# Patient Record
Sex: Male | Born: 1940 | Race: Black or African American | Hispanic: No | Marital: Married | Smoking: Former smoker
Health system: Southern US, Community
[De-identification: ages and names within clinical notes are randomized; demographics above are authoritative.]

## PROBLEM LIST (undated history)

## (undated) DIAGNOSIS — G4730 Sleep apnea, unspecified: Secondary | ICD-10-CM

## (undated) DIAGNOSIS — J45909 Unspecified asthma, uncomplicated: Secondary | ICD-10-CM

## (undated) DIAGNOSIS — R519 Headache, unspecified: Secondary | ICD-10-CM

## (undated) DIAGNOSIS — G8929 Other chronic pain: Secondary | ICD-10-CM

## (undated) DIAGNOSIS — I10 Essential (primary) hypertension: Secondary | ICD-10-CM

## (undated) DIAGNOSIS — E785 Hyperlipidemia, unspecified: Secondary | ICD-10-CM

## (undated) HISTORY — DX: Sleep apnea, unspecified: G47.30

## (undated) HISTORY — DX: Hyperlipidemia, unspecified: E78.5

## (undated) HISTORY — DX: Other chronic pain: G89.29

## (undated) HISTORY — DX: Headache, unspecified: R51.9

---

## 2019-12-29 DIAGNOSIS — Z809 Family history of malignant neoplasm, unspecified: Secondary | ICD-10-CM | POA: Diagnosis not present

## 2019-12-29 DIAGNOSIS — N400 Enlarged prostate without lower urinary tract symptoms: Secondary | ICD-10-CM | POA: Diagnosis not present

## 2019-12-29 DIAGNOSIS — I10 Essential (primary) hypertension: Secondary | ICD-10-CM | POA: Diagnosis not present

## 2019-12-29 DIAGNOSIS — Z9181 History of falling: Secondary | ICD-10-CM | POA: Diagnosis not present

## 2019-12-29 DIAGNOSIS — R269 Unspecified abnormalities of gait and mobility: Secondary | ICD-10-CM | POA: Diagnosis not present

## 2019-12-29 DIAGNOSIS — Z87891 Personal history of nicotine dependence: Secondary | ICD-10-CM | POA: Diagnosis not present

## 2019-12-29 DIAGNOSIS — R69 Illness, unspecified: Secondary | ICD-10-CM | POA: Diagnosis not present

## 2019-12-29 DIAGNOSIS — Z96649 Presence of unspecified artificial hip joint: Secondary | ICD-10-CM | POA: Diagnosis not present

## 2019-12-29 DIAGNOSIS — Z8249 Family history of ischemic heart disease and other diseases of the circulatory system: Secondary | ICD-10-CM | POA: Diagnosis not present

## 2020-01-12 DIAGNOSIS — M25562 Pain in left knee: Secondary | ICD-10-CM | POA: Diagnosis not present

## 2020-01-12 DIAGNOSIS — M1711 Unilateral primary osteoarthritis, right knee: Secondary | ICD-10-CM | POA: Diagnosis not present

## 2020-01-12 DIAGNOSIS — M25552 Pain in left hip: Secondary | ICD-10-CM | POA: Diagnosis not present

## 2020-01-12 DIAGNOSIS — M25561 Pain in right knee: Secondary | ICD-10-CM | POA: Diagnosis not present

## 2020-01-12 DIAGNOSIS — M25559 Pain in unspecified hip: Secondary | ICD-10-CM | POA: Diagnosis not present

## 2020-01-12 DIAGNOSIS — M25551 Pain in right hip: Secondary | ICD-10-CM | POA: Diagnosis not present

## 2020-01-12 DIAGNOSIS — M1712 Unilateral primary osteoarthritis, left knee: Secondary | ICD-10-CM | POA: Diagnosis not present

## 2020-02-05 DIAGNOSIS — M8580 Other specified disorders of bone density and structure, unspecified site: Secondary | ICD-10-CM | POA: Diagnosis not present

## 2020-02-05 DIAGNOSIS — R972 Elevated prostate specific antigen [PSA]: Secondary | ICD-10-CM | POA: Diagnosis not present

## 2020-02-05 DIAGNOSIS — M170 Bilateral primary osteoarthritis of knee: Secondary | ICD-10-CM | POA: Diagnosis not present

## 2020-02-05 DIAGNOSIS — K5904 Chronic idiopathic constipation: Secondary | ICD-10-CM | POA: Diagnosis not present

## 2020-02-05 DIAGNOSIS — R739 Hyperglycemia, unspecified: Secondary | ICD-10-CM | POA: Diagnosis not present

## 2020-02-05 DIAGNOSIS — N529 Male erectile dysfunction, unspecified: Secondary | ICD-10-CM | POA: Diagnosis not present

## 2020-02-05 DIAGNOSIS — R350 Frequency of micturition: Secondary | ICD-10-CM | POA: Diagnosis not present

## 2020-02-05 DIAGNOSIS — E559 Vitamin D deficiency, unspecified: Secondary | ICD-10-CM | POA: Diagnosis not present

## 2020-02-05 DIAGNOSIS — H9193 Unspecified hearing loss, bilateral: Secondary | ICD-10-CM | POA: Diagnosis not present

## 2020-02-05 DIAGNOSIS — N401 Enlarged prostate with lower urinary tract symptoms: Secondary | ICD-10-CM | POA: Diagnosis not present

## 2020-02-05 DIAGNOSIS — I10 Essential (primary) hypertension: Secondary | ICD-10-CM | POA: Diagnosis not present

## 2020-05-02 ENCOUNTER — Emergency Department (HOSPITAL_COMMUNITY): Payer: Medicare (Managed Care)

## 2020-05-02 ENCOUNTER — Other Ambulatory Visit: Payer: Self-pay

## 2020-05-02 ENCOUNTER — Encounter (HOSPITAL_COMMUNITY): Payer: Self-pay

## 2020-05-02 ENCOUNTER — Emergency Department (HOSPITAL_COMMUNITY)
Admission: EM | Admit: 2020-05-02 | Discharge: 2020-05-02 | Disposition: A | Discharge status: Home / Self Care | Payer: Medicare (Managed Care) | Attending: Emergency Medicine | Admitting: Emergency Medicine

## 2020-05-02 DIAGNOSIS — I10 Essential (primary) hypertension: Secondary | ICD-10-CM | POA: Insufficient documentation

## 2020-05-02 DIAGNOSIS — J4521 Mild intermittent asthma with (acute) exacerbation: Secondary | ICD-10-CM | POA: Insufficient documentation

## 2020-05-02 DIAGNOSIS — R0602 Shortness of breath: Secondary | ICD-10-CM | POA: Diagnosis present

## 2020-05-02 HISTORY — DX: Unspecified asthma, uncomplicated: J45.909

## 2020-05-02 HISTORY — DX: Essential (primary) hypertension: I10

## 2020-05-02 LAB — BASIC METABOLIC PANEL
Anion gap: 8 (ref 5–15)
BUN: 14 mg/dL (ref 8–23)
CO2: 29 mmol/L (ref 22–32)
Calcium: 9.4 mg/dL (ref 8.9–10.3)
Chloride: 104 mmol/L (ref 98–111)
Creatinine, Ser: 0.89 mg/dL (ref 0.61–1.24)
GFR calc Af Amer: >60 mL/min (ref >60)
GFR calc non Af Amer: >60 mL/min (ref >60)
Glucose, Bld: 112 mg/dL — ABNORMAL HIGH (ref 70–99)
Potassium: 4.5 mmol/L (ref 3.5–5.1)
Sodium: 141 mmol/L (ref 135–145)

## 2020-05-02 LAB — CBC
HCT: 41.3 % (ref 39.0–52.0)
Hemoglobin: 12.8 g/dL — ABNORMAL LOW (ref 13.0–17.0)
MCH: 30.8 pg (ref 26.0–34.0)
MCHC: 31 g/dL (ref 30.0–36.0)
MCV: 99.5 fL (ref 80.0–100.0)
Platelets: 262 10*3/uL (ref 150–400)
RBC: 4.15 MIL/uL — ABNORMAL LOW (ref 4.22–5.81)
RDW: 14.3 % (ref 11.5–15.5)
WBC: 8.2 10*3/uL (ref 4.0–10.5)
nRBC: 0 % (ref 0.0–0.2)

## 2020-05-02 LAB — TROPONIN I (HIGH SENSITIVITY): Troponin I (High Sensitivity): 11 ng/L (ref <18)

## 2020-05-02 MED ORDER — IPRATROPIUM BROMIDE HFA 17 MCG/ACT IN AERS
2.0000 | INHALATION_SPRAY | Freq: Once | RESPIRATORY_TRACT | Status: AC
  Administered 2020-05-02: 2 via RESPIRATORY_TRACT
  Filled 2020-05-02: qty 12.9

## 2020-05-02 MED ORDER — ALBUTEROL SULFATE HFA 108 (90 BASE) MCG/ACT IN AERS: 2.0000 | INHALATION_SPRAY | RESPIRATORY_TRACT | 0 refills | Status: DC | PRN

## 2020-05-02 MED ORDER — AEROCHAMBER PLUS FLO-VU LARGE MISC
1.0000 | Freq: Once | Status: AC
  Administered 2020-05-02: 1

## 2020-05-02 MED ORDER — IPRATROPIUM BROMIDE HFA 17 MCG/ACT IN AERS
2.0000 | INHALATION_SPRAY | Freq: Once | RESPIRATORY_TRACT | Status: AC
  Administered 2020-05-02: 2 via RESPIRATORY_TRACT

## 2020-05-02 MED ORDER — ALBUTEROL SULFATE HFA 108 (90 BASE) MCG/ACT IN AERS
8.0000 | INHALATION_SPRAY | Freq: Once | RESPIRATORY_TRACT | Status: AC
  Administered 2020-05-02: 8 via RESPIRATORY_TRACT

## 2020-05-02 MED ORDER — DEXAMETHASONE 4 MG PO TABS
10.0000 mg | ORAL_TABLET | Freq: Once | ORAL | Status: AC
  Administered 2020-05-02: 10 mg via ORAL
  Filled 2020-05-02: qty 3

## 2020-05-02 MED ORDER — SODIUM CHLORIDE 0.9% FLUSH: 3.0000 mL | Freq: Once | INTRAVENOUS | Status: DC

## 2020-05-02 MED ORDER — ALBUTEROL SULFATE HFA 108 (90 BASE) MCG/ACT IN AERS
8.0000 | INHALATION_SPRAY | Freq: Once | RESPIRATORY_TRACT | Status: AC
  Administered 2020-05-02: 8 via RESPIRATORY_TRACT
  Filled 2020-05-02: qty 6.7

## 2020-05-02 NOTE — ED Notes (Signed)
The pt still has audible wheezes but less and the pt reports that he feels and breathes much better  He reports that he has been breathing like this for 2-3 weeks  They just moved and the male in the room reports that she has not been able to find his meds

## 2020-05-02 NOTE — ED Triage Notes (Signed)
Patient complains of 2 days of cough, congestion and pain with inspiration. States that this started after getting caught in the rain on Thursday

## 2020-05-02 NOTE — ED Provider Notes (Signed)
[facility] EMERGENCY DEPARTMENT Provider Note   CSN: [id number] Arrival date & time: 05/02/20  1407     History Chief Complaint  Patient presents with  . SOB/ cough    Edward Hurst is a 79 y.o. male.  Patient is an 79 year old male presenting with wheezing and shortness of breath after cold and rain exposure on Thursday.  Patient's ABCs intact on arrival.  Patient has audible wheezing.  The history is provided by the patient and the spouse.  Illness Location:  Respiratory Quality:  Wheezing Severity:  Moderate Onset quality:  Gradual Timing:  Constant Progression:  Unchanged Chronicity:  Recurrent Context:  Patient has a history of asthma, ran out of inhalers Relieved by:  Nothing tried Worsened by:  Nothing Ineffective treatments:  None tried Associated symptoms: shortness of breath and wheezing   Associated symptoms: no abdominal pain, no chest pain, no congestion, no cough, no diarrhea, no fever, no headaches, no loss of consciousness, no nausea and no vomiting        Past Medical History:  Diagnosis Date  . Asthma   . Hypertension     There are no problems to display for this patient.   History reviewed. No pertinent surgical history.     No family history on file.  Social History   Tobacco Use  . Smoking status: Not on file  Substance Use Topics  . Alcohol use: Not on file  . Drug use: Not on file    Home Medications Prior to Admission medications   Medication Sig Start Date End Date Taking? Authorizing Provider  albuterol (VENTOLIN HFA) 108 (90 Base) MCG/ACT inhaler Inhale 2 puffs into the lungs every 4 (four) hours as needed for wheezing or shortness of breath. 05/02/20   Janeece Fitting, MD    Allergies    Patient has no known allergies.  Review of Systems   Review of Systems  Constitutional: Negative for fever.  HENT: Negative for congestion.   Respiratory: Positive for shortness of breath and wheezing. Negative for  cough.   Cardiovascular: Negative for chest pain.  Gastrointestinal: Negative for abdominal pain, diarrhea, nausea and vomiting.  Neurological: Negative for loss of consciousness and headaches.  All other systems reviewed and are negative.   Physical Exam Updated Vital Signs BP (!) 164/92 (BP Location: Right Arm)   Pulse (!) 102   Temp 98.2 F (36.8 C) (Oral)   Resp (!) 22   Ht 5\' 9"  (1.753 m)   Wt 63.5 kg   SpO2 95%   BMI 20.67 kg/m   Physical Exam Vitals and nursing note reviewed.  Constitutional:      Appearance: He is well-developed.  HENT:     Head: Normocephalic and atraumatic.     Right Ear: External ear normal.     Left Ear: External ear normal.     Nose: Nose normal.     Mouth/Throat:     Mouth: Mucous membranes are moist.     Comments: Poor dentition Eyes:     Extraocular Movements: Extraocular movements intact.     Conjunctiva/sclera: Conjunctivae normal.  Cardiovascular:     Rate and Rhythm: Normal rate and regular rhythm.     Heart sounds: No murmur heard.   Pulmonary:     Effort: Pulmonary effort is normal. No respiratory distress.     Breath sounds: Wheezing present.     Comments: Patient had expiratory wheezing in all fields prior to inhaler treatment.  Wheezing significant improved after initial  treatment but still present.  Additional improvement after second treatment. Abdominal:     Palpations: Abdomen is soft.     Tenderness: There is no abdominal tenderness.  Musculoskeletal:        General: No swelling, tenderness, deformity or signs of injury. Normal range of motion.     Cervical back: Neck supple.  Skin:    General: Skin is warm and dry.  Neurological:     General: No focal deficit present.     Mental Status: He is alert and oriented to person, place, and time.  Psychiatric:        Mood and Affect: Mood normal.        Behavior: Behavior normal.     ED Results / Procedures / Treatments   Labs (all labs ordered are listed, but only  abnormal results are displayed) Labs Reviewed  BASIC METABOLIC PANEL - Abnormal; Notable for the following components:      Result Value   Glucose, Bld 112 (*)    All other components within normal limits  CBC - Abnormal; Notable for the following components:   RBC 4.15 (*)    Hemoglobin 12.8 (*)    All other components within normal limits  POC SARS CORONAVIRUS 2 AG -  ED  TROPONIN I (HIGH SENSITIVITY)    EKG EKG Interpretation  Date/Time:  Sunday May 02 2020 14:13:27 EDT Ventricular Rate:  88 PR Interval:  184 QRS Duration: 76 QT Interval:  340 QTC Calculation: 411 R Axis:   80 Text Interpretation: Normal sinus rhythm Possible Anterior infarct , age undetermined No previous tracing Confirmed by Gwyneth Sprout ([id number]) on 05/02/2020 4:01:21 PM   Radiology DG Chest 2 View  Result Date: 05/02/2020 CLINICAL DATA:  Shortness of breath.  Pain on inspiration. EXAM: CHEST - 2 VIEW COMPARISON:  No prior exams available. FINDINGS: Upper normal heart size. Aortic tortuosity. Mild peribronchial thickening of unknown acuity. Questionable not definite 2.1 cm nodule in the right mid lung zone versus vascular overlap. No focal airspace disease, pneumothorax, or pleural effusion. No pulmonary edema. Age related degenerative change in the spine and shoulders. No acute osseous abnormalities are seen. IMPRESSION: 1. Mild peribronchial thickening of unknown acuity. 2. Questionable 2.1 cm nodule in the right mid lung zone versus vascular overlap. There are no prior exams available for comparison. Recommend further evaluation with chest CT. This could be performed on an elective basis. 3. Upper normal heart size with aortic tortuosity. Electronically Signed   By: Narda Rutherford M.D.   On: 05/02/2020 15:23    Procedures Procedures (including critical care time)  Medications Ordered in ED Medications  albuterol (VENTOLIN HFA) 108 (90 Base) MCG/ACT inhaler 8 puff (8 puffs Inhalation Given 05/02/20  1615)  dexamethasone (DECADRON) tablet 10 mg (10 mg Oral Given 05/02/20 1621)  ipratropium (ATROVENT HFA) inhaler 2 puff (2 puffs Inhalation Given 05/02/20 1615)  AeroChamber Plus Flo-Vu Large MISC 1 each (1 each Other Given 05/02/20 1618)  ipratropium (ATROVENT HFA) inhaler 2 puff (2 puffs Inhalation Given 05/02/20 1749)  albuterol (VENTOLIN HFA) 108 (90 Base) MCG/ACT inhaler 8 puff (8 puffs Inhalation Given 05/02/20 1749)    ED Course  I have reviewed the triage vital signs and the nursing notes.  Pertinent labs & imaging results that were available during my care of the patient were reviewed by me and considered in my medical decision making (see chart for details).    MDM Rules/Calculators/A&P  Differential diagnosis: Asthma exacerbation, COVID-19, pneumonia ED physician interpretation of imaging: Chest x-ray without hemopneumothorax, wide mediastinum, focal pneumonia. ED physician interpretation of EKG: No STEMI. NSR ED physician interpretation of labs: Patient's lab work including CBC, BMP, troponin and Covid swab without critical values.  MDM: Patient is a 79 year old male with a history of asthma who has lost his inhalers during a move presents to the ED with several days of wheezing, cough that improved with two times MDI treatment with unremarkable chest x-ray troponin and lab work found to have asthma exacerbation that resolved with ED treatment appropriate for discharge home outpatient follow-up.  Patient's vital signs are stable, patient afebrile.  His physical exam is remarkable for wheezing that improved with Atrovent albuterol treatments.  Patient was educated on use of AeroChamber and inhalers and did well.  Steroids given.  Social work contacted and talked the patient about places to follow-up outpatient as the family recently moved to [place].  Patient in bed without difficulty.  Patient has no chest pain and chest x-ray, EKG and one troponin  unremarkable.,  Doubt ACS.  Most likely patient had acute asthma exacerbation due to loss of rescue medications.  Diagnosis, treatment and plan of care was discussed and agreed upon with patient.  Patient comfortable with discharge at this time.   Key discharge instructions: You presented to the ED with wheezing and shortness of breath consistent with an asthma exacerbation.  You are given 2 rounds of metered-dose inhaler with spacer with improvement of symptoms.  Additionally were given oral steroids that should help prevent recurrence over the next several days.  Recommend taking scheduled 4 puffs albuterol every 6 hours for the next 2 days.  Then take albuterol every 4 hours as needed.  If you feel like you are having an asthma exacerbation in the future you may take the Atrovent inhaler 2 puffs at that time.  Please call the number listed in your discharge paperwork to establish primary care and follow-up for your asthma and hypertension.   Final Clinical Impression(s) / ED Diagnoses Final diagnoses:  Mild intermittent asthma with exacerbation    Rx / DC Orders ED Discharge Orders         Ordered    albuterol (VENTOLIN HFA) 108 (90 Base) MCG/ACT inhaler  Every 4 hours PRN     Discontinue  Reprint     05/02/20 1904           Delma Post, MD 05/03/20 0932    Blanchie Dessert, MD 05/03/20 2151

## 2020-05-02 NOTE — Discharge Instructions (Signed)
You presented to the ED with wheezing and shortness of breath consistent with an asthma exacerbation.  You are given 2 rounds of metered-dose inhaler with spacer with improvement of symptoms.  Additionally were given oral steroids that should help prevent recurrence over the next several days.  Recommend taking scheduled 4 puffs albuterol every 6 hours for the next 2 days.  Then take albuterol every 4 hours as needed.  If you feel like you are having an asthma exacerbation in the future you may take the Atrovent inhaler 2 puffs at that time.  Please call the number listed in your discharge paperwork to establish primary care and follow-up for your asthma and hypertension.

## 2020-05-02 NOTE — ED Notes (Signed)
Pt was ambulated between hallways with no falls or injuries. Pt O2 remained between 95-100%. Pt had some wheezing but pt denies difficulty breathing at this time. Pt was placed back into bed with O2 and cardiac monitor hookup.

## 2020-05-07 ENCOUNTER — Emergency Department (HOSPITAL_COMMUNITY)
Admission: EM | Admit: 2020-05-07 | Discharge: 2020-05-08 | Disposition: A | Discharge status: Home / Self Care | Payer: Medicare (Managed Care) | Attending: Emergency Medicine | Admitting: Emergency Medicine

## 2020-05-07 ENCOUNTER — Other Ambulatory Visit: Payer: Self-pay

## 2020-05-07 ENCOUNTER — Encounter (HOSPITAL_COMMUNITY): Payer: Self-pay | Admitting: Emergency Medicine

## 2020-05-07 DIAGNOSIS — U071 COVID-19: Secondary | ICD-10-CM | POA: Diagnosis not present

## 2020-05-07 DIAGNOSIS — R0602 Shortness of breath: Secondary | ICD-10-CM | POA: Diagnosis present

## 2020-05-07 DIAGNOSIS — J45909 Unspecified asthma, uncomplicated: Secondary | ICD-10-CM | POA: Insufficient documentation

## 2020-05-07 DIAGNOSIS — I10 Essential (primary) hypertension: Secondary | ICD-10-CM | POA: Diagnosis not present

## 2020-05-07 DIAGNOSIS — Z87891 Personal history of nicotine dependence: Secondary | ICD-10-CM | POA: Diagnosis not present

## 2020-05-07 DIAGNOSIS — R062 Wheezing: Secondary | ICD-10-CM

## 2020-05-07 LAB — CBC
HCT: 38.4 % — ABNORMAL LOW (ref 39.0–52.0)
Hemoglobin: 12.2 g/dL — ABNORMAL LOW (ref 13.0–17.0)
MCH: 31.1 pg (ref 26.0–34.0)
MCHC: 31.8 g/dL (ref 30.0–36.0)
MCV: 98 fL (ref 80.0–100.0)
Platelets: 213 10*3/uL (ref 150–400)
RBC: 3.92 MIL/uL — ABNORMAL LOW (ref 4.22–5.81)
RDW: 14.6 % (ref 11.5–15.5)
WBC: 7.9 10*3/uL (ref 4.0–10.5)
nRBC: 0 % (ref 0.0–0.2)

## 2020-05-07 LAB — D-DIMER, QUANTITATIVE: D-Dimer, Quant: 0.51 ug/mL-FEU — ABNORMAL HIGH (ref 0.00–0.50)

## 2020-05-07 LAB — COMPREHENSIVE METABOLIC PANEL
ALT: 25 U/L (ref 0–44)
AST: 23 U/L (ref 15–41)
Albumin: 3.7 g/dL (ref 3.5–5.0)
Alkaline Phosphatase: 75 U/L (ref 38–126)
Anion gap: 6 (ref 5–15)
BUN: 12 mg/dL (ref 8–23)
CO2: 27 mmol/L (ref 22–32)
Calcium: 8.8 mg/dL — ABNORMAL LOW (ref 8.9–10.3)
Chloride: 100 mmol/L (ref 98–111)
Creatinine, Ser: 0.76 mg/dL (ref 0.61–1.24)
GFR calc Af Amer: >60 mL/min (ref >60)
GFR calc non Af Amer: >60 mL/min (ref >60)
Glucose, Bld: 151 mg/dL — ABNORMAL HIGH (ref 70–99)
Potassium: 4.4 mmol/L (ref 3.5–5.1)
Sodium: 133 mmol/L — ABNORMAL LOW (ref 135–145)
Total Bilirubin: 0.9 mg/dL (ref 0.3–1.2)
Total Protein: 6.6 g/dL (ref 6.5–8.1)

## 2020-05-07 LAB — POC SARS CORONAVIRUS 2 AG -  ED: SARS Coronavirus 2 Ag: POSITIVE — AB

## 2020-05-07 MED ORDER — ALBUTEROL SULFATE (2.5 MG/3ML) 0.083% IN NEBU
5.0000 mg | INHALATION_SOLUTION | Freq: Once | RESPIRATORY_TRACT | Status: AC
  Administered 2020-05-07: 5 mg via RESPIRATORY_TRACT
  Filled 2020-05-07: qty 6

## 2020-05-07 MED ORDER — MAGNESIUM SULFATE 2 GM/50ML IV SOLN
2.0000 g | Freq: Once | INTRAVENOUS | Status: AC
  Administered 2020-05-07: 2 g via INTRAVENOUS
  Filled 2020-05-07: qty 50

## 2020-05-07 NOTE — ED Provider Notes (Signed)
[facility] EMERGENCY DEPARTMENT Provider Note   CSN: [id number] Arrival date & time: 05/07/20  2219     History Chief Complaint  Patient presents with  . Shortness of Breath    Edward Hurst is a 78 y.o. male presenting for evaluation of shortness of breath.  Patient states the past week, he has had shortness of breath.  He was seen in the ER, given breathing treatments and a nebulizer with improvement of symptoms.  However tonight, his breathing worsened again.  He was sent home with inhalers, but did not use them today.  He denies fevers, chills, chest pain, nausea, vomiting, abdominal pain.  He reports a history of lung problems, although is not sure if he has officially been diagnosed with asthma and/or COPD.  He does not wear oxygen at home.  He recently moved to [place] from [place] a month ago, has not yet established with primary care but has an appointment scheduled for later this month. He drove (5 day trip) 1 month ago from [place] to [place].  He does not smoke cigarettes, though he used to, quit 30 years ago.  He was given a breathing tx and solumedrol with EMS, reports resolution of his SOB.  He reports no additional history of prediabetes (on Metformin) and hypertension.  Additional history obtained from chart review. Reviewed previous ED visit.   HPI     Past Medical History:  Diagnosis Date  . Asthma   . Hypertension     There are no problems to display for this patient.   History reviewed. No pertinent surgical history.     No family history on file.  Social History   Tobacco Use  . Smoking status: Former Research scientist (life sciences)  . Smokeless tobacco: Never Used  Substance Use Topics  . Alcohol use: Not Currently  . Drug use: Never    Home Medications Prior to Admission medications   Medication Sig Start Date End Date Taking? Authorizing Provider  albuterol (VENTOLIN HFA) 108 (90 Base) MCG/ACT inhaler Inhale 2 puffs into the lungs every 4 (four) hours as  needed for wheezing or shortness of breath. 05/02/20   Delma Post, MD  benzonatate (TESSALON) 100 MG capsule Take 1 capsule (100 mg total) by mouth 2 (two) times daily as needed for cough. 05/08/20   Quindell Shere, PA-C  predniSONE (DELTASONE) 20 MG tablet Take 2 tablets (40 mg total) by mouth daily for 4 days. 05/08/20 05/12/20  Wylie Russon, PA-C    Allergies    Patient has no known allergies.  Review of Systems   Review of Systems  Respiratory: Positive for cough, shortness of breath and wheezing.   All other systems reviewed and are negative.   Physical Exam Updated Vital Signs BP 123/85   Pulse 81   Temp 98.5 F (36.9 C) (Oral)   Resp (!) 24   SpO2 97%   Physical Exam Vitals and nursing note reviewed.  Constitutional:      General: He is not in acute distress.    Appearance: He is well-developed.     Comments: Elderly male who appears nontoxic  HENT:     Head: Normocephalic and atraumatic.  Eyes:     Conjunctiva/sclera: Conjunctivae normal.     Pupils: Pupils are equal, round, and reactive to light.  Cardiovascular:     Rate and Rhythm: Normal rate and regular rhythm.     Pulses: Normal pulses.  Pulmonary:     Effort: Pulmonary effort is normal. No respiratory  distress.     Breath sounds: Wheezing present.     Comments: Expiratory wheezing in all fields. Speaking in full sentences. sats stable on RA Abdominal:     General: There is no distension.     Palpations: Abdomen is soft. There is no mass.     Tenderness: There is no abdominal tenderness. There is no guarding or rebound.  Musculoskeletal:        General: Normal range of motion.     Cervical back: Normal range of motion and neck supple.     Right lower leg: No edema.     Left lower leg: No edema.     Comments: No leg pain or swelling  Skin:    General: Skin is warm and dry.     Capillary Refill: Capillary refill takes less than 2 seconds.  Neurological:     Mental Status: He is alert and  oriented to person, place, and time.     ED Results / Procedures / Treatments   Labs (all labs ordered are listed, but only abnormal results are displayed) Labs Reviewed  D-DIMER, QUANTITATIVE (NOT AT [facility]) - Abnormal; Notable for the following components:      Result Value   D-Dimer, Quant 0.51 (*)    All other components within normal limits  CBC - Abnormal; Notable for the following components:   RBC 3.92 (*)    Hemoglobin 12.2 (*)    HCT 38.4 (*)    All other components within normal limits  COMPREHENSIVE METABOLIC PANEL - Abnormal; Notable for the following components:   Sodium 133 (*)    Glucose, Bld 151 (*)    Calcium 8.8 (*)    All other components within normal limits  POC SARS CORONAVIRUS 2 AG -  ED - Abnormal; Notable for the following components:   SARS Coronavirus 2 Ag POSITIVE (*)    All other components within normal limits  TROPONIN I (HIGH SENSITIVITY)    EKG EKG Interpretation  Date/Time:  Friday May 07 2020 22:24:09 EDT Ventricular Rate:  86 PR Interval:    QRS Duration: 90 QT Interval:  373 QTC Calculation: 447 R Axis:   74 Text Interpretation: Sinus rhythm Possible Anterior Infarct, age undetermined Confirmed by Geoffery Lyons ([id number]) on 05/07/2020 10:30:35 PM   Radiology DG Chest Portable 1 View  Result Date: 05/08/2020 CLINICAL DATA:  Shortness of breath EXAM: PORTABLE CHEST 1 VIEW COMPARISON:  May 02, 2020 FINDINGS: The heart size and mediastinal contours are within normal limits. Both lungs are clear. The visualized skeletal structures are unremarkable. IMPRESSION: No active disease. Electronically Signed   By: Jonna Clark M.D.   On: 05/08/2020 00:52    Procedures Procedures (including critical care time)  Medications Ordered in ED Medications  0.9 %  sodium chloride infusion (has no administration in time range)  diphenhydrAMINE (BENADRYL) injection 50 mg (has no administration in time range)  famotidine (PEPCID) IVPB 20 mg premix (has  no administration in time range)  EPINEPHrine (EPI-PEN) injection 0.3 mg (has no administration in time range)  albuterol (PROVENTIL) (2.5 MG/3ML) 0.083% nebulizer solution 5 mg (5 mg Nebulization Given 05/07/20 2307)  magnesium sulfate IVPB 2 g 50 mL (0 g Intravenous Stopped 05/08/20 0033)  bamlanivimab 700 mg, etesevimab 1,400 mg in sodium chloride 0.9 % 160 mL IVPB ( Intravenous Stopped 05/08/20 0221)    ED Course  I have reviewed the triage vital signs and the nursing notes.  Pertinent labs & imaging results that were  available during my care of the patient were reviewed by me and considered in my medical decision making (see chart for details).    MDM Rules/Calculators/A&P                          Patient presented for evaluation of shortness of breath, wheezing, and cough.  On exam, patient appears nontoxic.  His symptoms improved with Combivent and Solu-Medrol, as such likely related to COPD/asthma.  However considering patient's recent long trip, consider PE. Will obtain ddimer.  Less likely ACS in the absence of chest pain, however considering patient's age will obtain EKG and troponin. Less likely CHF without swelling and normal cxr last week. Less likley pna, however considering continued sxs, will repeat cxr. Case discussed with attending, Dr. Nicanor Alcon evaluated the pt.   Labs interpreted by me, overall reassuring.  Troponin negative.  D-dimer, when age-adjusted, is negative.  However patient's rapid Covid test was positive, likely explaining the cause for his worsening wheezing and shortness of breath.  Patient saturations, however, are reassuring, and his respiratory status is reassuring.  When patient ambulated, no hypoxia, significant tachypnea, or signs of respiratory distress.  On reassessment, patient reports he continues to feel he is breathing well, improved once again with a nebulizer treatment.  He meets criteria for the monoclonal antibody treatment considering his diabetes,  lung disease, and age.   On reassessment, pt remains well appearing and without hypoxia or respiratory distress.  Discussed importance of continued monitoring of respiratory status.  Discussed quarantine for himself as well as for people he has been in contact with.  Discussed symptomatic treatment, and return to the ER with worsening breathing.  At this time, patient appears safe for discharge.  Return precautions given.  Patient states he understands and agrees to plan.  Final Clinical Impression(s) / ED Diagnoses Final diagnoses:  COVID-19  Shortness of breath  Wheezing    Rx / DC Orders ED Discharge Orders         Ordered    predniSONE (DELTASONE) 20 MG tablet  Daily     Discontinue  Reprint     05/08/20 0240    benzonatate (TESSALON) 100 MG capsule  2 times daily PRN     Discontinue  Reprint     05/08/20 0240           Alveria Apley, PA-C 05/08/20 0327    Palumbo, April, MD 05/08/20 1655

## 2020-05-07 NOTE — ED Triage Notes (Signed)
Pt arrives via [facility] c/o [facility] with cough since the 10th. Pt given 125 mg solumedrol, 5mg  albuterol and 0.5 atrovent enroute which provided relief. Pt does not c/o of ShoB at this time. VSS and A+Ox4 at this time

## 2020-05-08 ENCOUNTER — Emergency Department (HOSPITAL_COMMUNITY): Payer: Medicare (Managed Care)

## 2020-05-08 ENCOUNTER — Other Ambulatory Visit: Payer: Self-pay | Admitting: Adult Health

## 2020-05-08 DIAGNOSIS — U071 COVID-19: Secondary | ICD-10-CM

## 2020-05-08 LAB — TROPONIN I (HIGH SENSITIVITY): Troponin I (High Sensitivity): 7 ng/L (ref <18)

## 2020-05-08 MED ORDER — EPINEPHRINE 0.3 MG/0.3ML IJ SOAJ: 0.3000 mg | Freq: Once | INTRAMUSCULAR | Status: DC | PRN

## 2020-05-08 MED ORDER — BENZONATATE 100 MG PO CAPS
100.0000 mg | ORAL_CAPSULE | Freq: Two times a day (BID) | ORAL | 0 refills | Status: DC | PRN
Start: 2020-05-08 — End: 2021-05-31

## 2020-05-08 MED ORDER — SODIUM CHLORIDE 0.9 % IV SOLN: INTRAVENOUS | Status: DC | PRN

## 2020-05-08 MED ORDER — FAMOTIDINE IN NACL 20-0.9 MG/50ML-% IV SOLN: 20.0000 mg | Freq: Once | INTRAVENOUS | Status: DC | PRN

## 2020-05-08 MED ORDER — SODIUM CHLORIDE 0.9 % IV SOLN
Freq: Once | INTRAVENOUS | Status: AC
  Filled 2020-05-08: qty 20

## 2020-05-08 MED ORDER — PREDNISONE 20 MG PO TABS
40.0000 mg | ORAL_TABLET | Freq: Every day | ORAL | 0 refills | Status: AC
Start: 2020-05-08 — End: 2020-05-12

## 2020-05-08 MED ORDER — DIPHENHYDRAMINE HCL 50 MG/ML IJ SOLN: 50.0000 mg | Freq: Once | INTRAMUSCULAR | Status: DC | PRN

## 2020-05-08 NOTE — Discharge Instructions (Signed)
Take prednisone as prescribed. Use Tessalon as needed for cough. Use your inhaler every 4 hours the next 2 days.  After this, use as needed for shortness of breath or chest tightness or wheezing. Return to the emergency room if you develop worsening shortness of breath or difficulty breathing. It is important that you quarantine at home at least 3 more days.  You may end quarantine if you remain fever free and your symptoms are improving. It is important that you inform anyone you have been in contact with that you are Covid positive, and they will need to quarantine at home.

## 2020-05-08 NOTE — ED Notes (Signed)
Pt verbalized understanding of d/c instructions, follow up care and s/s requiring return to ed. Pt had no further questions at this time and was transported via wheelchair to exit.

## 2020-05-08 NOTE — ED Notes (Signed)
Pt ambulated around room. Pt o2 maintained above 95% and heart rate increased from 88 to 95 BPM

## 2020-05-08 NOTE — Progress Notes (Signed)
  I connected by phone with [name] on 05/08/2020 at 8:16 AM to discuss the potential use of an new treatment for mild to moderate COVID-19 viral infection in non-hospitalized patients.  This patient is a 79 y.o. male that meets the FDA criteria for Emergency Use Authorization of bamlanivimab/etesevimab or casirivimab/imdevimab.  Has a (+) direct SARS-CoV-2 viral test result  Has mild or moderate COVID-19   Is NOT hospitalized due to COVID-19  Is within 10 days of symptom onset  Has at least one of the high risk factor(s) for progression to severe COVID-19 and/or hospitalization as defined in EUA.  Specific high risk criteria : Older age (>/= 81 yo)   I have spoken and communicated the following to the patient or parent/caregiver:  1. FDA has authorized the emergency use of bamlanivimab/etesevimab and casirivimab\imdevimab for the treatment of mild to moderate COVID-19 in adults and pediatric patients with positive results of direct SARS-CoV-2 viral testing who are 52 years of age and older weighing at least 40 kg, and who are at high risk for progressing to severe COVID-19 and/or hospitalization.  2. The significant known and potential risks and benefits of bamlanivimab/etesevimab and casirivimab\imdevimab, and the extent to which such potential risks and benefits are unknown.  3. Information on available alternative treatments and the risks and benefits of those alternatives, including clinical trials.  4. Patients treated with bamlanivimab/etesevimab and casirivimab\imdevimab should continue to self-isolate and use infection control measures (e.g., wear mask, isolate, social distance, avoid sharing personal items, clean and disinfect "high touch" surfaces, and frequent handwashing) according to CDC guidelines.   5. The patient or parent/caregiver has the option to accept or refuse bamlanivimab/etesevimab or casirivimab\imdevimab .  After reviewing this information with the patient,  The patient agreed to proceed with receiving the bamlanimivab infusion and will be provided a copy of the Fact sheet prior to receiving the infusion.Cherie Ouch Latwan Luchsinger 05/08/2020 8:16 AM

## 2020-05-10 ENCOUNTER — Ambulatory Visit (HOSPITAL_COMMUNITY)
Admission: RE | Admit: 2020-05-10 | Discharge: 2020-05-10 | Disposition: A | Discharge status: Home / Self Care | Payer: Medicare Other | Source: Ambulatory Visit | Attending: Pulmonary Disease | Admitting: Pulmonary Disease

## 2020-05-10 DIAGNOSIS — U071 COVID-19: Secondary | ICD-10-CM | POA: Insufficient documentation

## 2020-05-10 DIAGNOSIS — Z23 Encounter for immunization: Secondary | ICD-10-CM | POA: Insufficient documentation

## 2020-05-10 MED ORDER — ALBUTEROL SULFATE HFA 108 (90 BASE) MCG/ACT IN AERS: 2.0000 | INHALATION_SPRAY | Freq: Once | RESPIRATORY_TRACT | Status: DC | PRN

## 2020-05-10 MED ORDER — EPINEPHRINE 0.3 MG/0.3ML IJ SOAJ: 0.3000 mg | Freq: Once | INTRAMUSCULAR | Status: DC | PRN

## 2020-05-10 MED ORDER — SODIUM CHLORIDE 0.9 % IV SOLN: INTRAVENOUS | Status: DC | PRN

## 2020-05-10 MED ORDER — FAMOTIDINE IN NACL 20-0.9 MG/50ML-% IV SOLN: 20.0000 mg | Freq: Once | INTRAVENOUS | Status: DC | PRN

## 2020-05-10 MED ORDER — SODIUM CHLORIDE 0.9 % IV SOLN
Freq: Once | INTRAVENOUS | Status: AC
  Filled 2020-05-10: qty 700

## 2020-05-10 MED ORDER — DIPHENHYDRAMINE HCL 50 MG/ML IJ SOLN: 50.0000 mg | Freq: Once | INTRAMUSCULAR | Status: DC | PRN

## 2020-05-10 MED ORDER — METHYLPREDNISOLONE SODIUM SUCC 125 MG IJ SOLR: 125.0000 mg | Freq: Once | INTRAMUSCULAR | Status: DC | PRN

## 2020-05-10 NOTE — Discharge Instructions (Signed)

## 2020-05-10 NOTE — Progress Notes (Signed)
  Diagnosis: COVID-19  Physician: Dr Shan Levans  Procedure: [facility] Med: bamlanivimab\etesevimab infusion - Provided patient with bamlanimivab\etesevimab fact sheet for patients, parents and caregivers prior to infusion.  Complications: No immediate complications noted.  Discharge: Discharged home   William Dalton 05/10/2020

## 2020-05-14 ENCOUNTER — Other Ambulatory Visit: Payer: Self-pay

## 2020-05-14 ENCOUNTER — Encounter (HOSPITAL_COMMUNITY): Payer: Self-pay

## 2020-05-14 ENCOUNTER — Emergency Department (HOSPITAL_COMMUNITY): Payer: Medicare (Managed Care)

## 2020-05-14 ENCOUNTER — Emergency Department (HOSPITAL_COMMUNITY)
Admission: EM | Admit: 2020-05-14 | Discharge: 2020-05-15 | Disposition: A | Discharge status: Home / Self Care | Payer: Medicare (Managed Care) | Attending: Emergency Medicine | Admitting: Emergency Medicine

## 2020-05-14 DIAGNOSIS — R0600 Dyspnea, unspecified: Secondary | ICD-10-CM | POA: Diagnosis not present

## 2020-05-14 DIAGNOSIS — R05 Cough: Secondary | ICD-10-CM | POA: Diagnosis not present

## 2020-05-14 DIAGNOSIS — R059 Cough, unspecified: Secondary | ICD-10-CM

## 2020-05-14 DIAGNOSIS — Z87891 Personal history of nicotine dependence: Secondary | ICD-10-CM | POA: Insufficient documentation

## 2020-05-14 DIAGNOSIS — I10 Essential (primary) hypertension: Secondary | ICD-10-CM | POA: Insufficient documentation

## 2020-05-14 DIAGNOSIS — J45909 Unspecified asthma, uncomplicated: Secondary | ICD-10-CM | POA: Insufficient documentation

## 2020-05-14 LAB — CBC
HCT: 43 % (ref 39.0–52.0)
Hemoglobin: 13.5 g/dL (ref 13.0–17.0)
MCH: 31 pg (ref 26.0–34.0)
MCHC: 31.4 g/dL (ref 30.0–36.0)
MCV: 98.6 fL (ref 80.0–100.0)
Platelets: 253 10*3/uL (ref 150–400)
RBC: 4.36 MIL/uL (ref 4.22–5.81)
RDW: 14.5 % (ref 11.5–15.5)
WBC: 12.2 10*3/uL — ABNORMAL HIGH (ref 4.0–10.5)
nRBC: 0 % (ref 0.0–0.2)

## 2020-05-14 LAB — BASIC METABOLIC PANEL
Anion gap: 9 (ref 5–15)
BUN: 9 mg/dL (ref 8–23)
CO2: 30 mmol/L (ref 22–32)
Calcium: 9 mg/dL (ref 8.9–10.3)
Chloride: 99 mmol/L (ref 98–111)
Creatinine, Ser: 0.82 mg/dL (ref 0.61–1.24)
GFR calc Af Amer: >60 mL/min (ref >60)
GFR calc non Af Amer: >60 mL/min (ref >60)
Glucose, Bld: 147 mg/dL — ABNORMAL HIGH (ref 70–99)
Potassium: 4.4 mmol/L (ref 3.5–5.1)
Sodium: 138 mmol/L (ref 135–145)

## 2020-05-14 NOTE — ED Triage Notes (Signed)
Pt reports continued cough and sob for the past 2 weeks, pt given albuterol inhaler without relief. resp e.u at this time.

## 2020-05-15 MED ORDER — PREDNISONE 20 MG PO TABS
ORAL_TABLET | ORAL | 0 refills | Status: DC
Start: 2020-05-15 — End: 2021-03-15

## 2020-05-15 MED ORDER — IPRATROPIUM BROMIDE HFA 17 MCG/ACT IN AERS
2.0000 | INHALATION_SPRAY | Freq: Once | RESPIRATORY_TRACT | Status: AC
  Administered 2020-05-15: 2 via RESPIRATORY_TRACT
  Filled 2020-05-15: qty 12.9

## 2020-05-15 MED ORDER — ALBUTEROL SULFATE HFA 108 (90 BASE) MCG/ACT IN AERS: 4.0000 | INHALATION_SPRAY | Freq: Once | RESPIRATORY_TRACT | Status: DC

## 2020-05-15 MED ORDER — ALBUTEROL SULFATE HFA 108 (90 BASE) MCG/ACT IN AERS
8.0000 | INHALATION_SPRAY | Freq: Once | RESPIRATORY_TRACT | Status: AC
  Administered 2020-05-15: 8 via RESPIRATORY_TRACT
  Filled 2020-05-15: qty 6.7

## 2020-05-15 MED ORDER — PREDNISONE 20 MG PO TABS
60.0000 mg | ORAL_TABLET | Freq: Once | ORAL | Status: AC
  Administered 2020-05-15: 60 mg via ORAL
  Filled 2020-05-15: qty 3

## 2020-05-15 MED ORDER — BENZONATATE 100 MG PO CAPS
100.0000 mg | ORAL_CAPSULE | Freq: Once | ORAL | Status: AC
  Administered 2020-05-15: 100 mg via ORAL
  Filled 2020-05-15: qty 1

## 2020-05-15 NOTE — ED Notes (Signed)
The pt has had some difficulty breathing for 3 weeks  He had a covid swab done approx one week ago    More difficulty tonight  A and  o

## 2020-05-15 NOTE — ED Notes (Signed)
The pt still audible wheezes  He reports that he feels better

## 2020-05-15 NOTE — ED Provider Notes (Signed)
[facility] EMERGENCY DEPARTMENT Provider Note   CSN: [id number] Arrival date & time: 05/14/20  1459     History Chief Complaint  Patient presents with  . Cough  . Shortness of Breath    Edward Hurst is a 79 y.o. male.  Multiple medical problems document below here for the third time in 2 weeks secondary to cough and shortness of breath. The first time he was treated for asthma and had improved and was discharged. Then last week it was a similar situation and was worked up and treated for asthma and ruled out for PE and had improved emergency room apparently was diagnosed with Covid subsequently and had some infusions. Patient is afebrile and sometimes will have a productive cough but no other changes recently. No lower extremity swelling.   Cough Associated symptoms: shortness of breath   Shortness of Breath Associated symptoms: cough        Past Medical History:  Diagnosis Date  . Asthma   . Hypertension     There are no problems to display for this patient.   History reviewed. No pertinent surgical history.     No family history on file.  Social History   Tobacco Use  . Smoking status: Former Games developer  . Smokeless tobacco: Never Used  Substance Use Topics  . Alcohol use: Not Currently  . Drug use: Never    Home Medications Prior to Admission medications   Medication Sig Start Date End Date Taking? Authorizing Provider  albuterol (VENTOLIN HFA) 108 (90 Base) MCG/ACT inhaler Inhale 2 puffs into the lungs every 4 (four) hours as needed for wheezing or shortness of breath. 05/02/20   Janeece Fitting, MD  benzonatate (TESSALON) 100 MG capsule Take 1 capsule (100 mg total) by mouth 2 (two) times daily as needed for cough. 05/08/20   Caccavale, Sophia, PA-C  predniSONE (DELTASONE) 20 MG tablet 3 tabs po day one, then 2 po daily x 4 days 05/15/20   Oluwadamilola Deliz, Barbara Cower, MD    Allergies    Patient has no known allergies.  Review of Systems   Review of  Systems  Respiratory: Positive for cough and shortness of breath.   All other systems reviewed and are negative.   Physical Exam Updated Vital Signs BP (!) 156/97   Pulse 80   Temp 98.4 F (36.9 C) (Oral)   Resp 18   Ht 5\' 9"  (1.753 m)   Wt 78 kg   SpO2 98%   BMI 25.40 kg/m   Physical Exam Vitals and nursing note reviewed.  Constitutional:      Appearance: He is well-developed.  HENT:     Head: Normocephalic and atraumatic.     Nose: No congestion or rhinorrhea.     Mouth/Throat:     Mouth: Mucous membranes are moist.     Pharynx: Oropharynx is clear.  Eyes:     Pupils: Pupils are equal, round, and reactive to light.  Cardiovascular:     Rate and Rhythm: Normal rate.  Pulmonary:     Effort: Pulmonary effort is normal. Tachypnea present. No respiratory distress.     Breath sounds: No stridor. Wheezing present. No decreased breath sounds, rhonchi or rales.  Abdominal:     General: There is no distension.  Musculoskeletal:        General: Normal range of motion.     Cervical back: Normal range of motion.  Skin:    General: Skin is warm and dry.  Neurological:  General: No focal deficit present.     Mental Status: He is alert.     ED Results / Procedures / Treatments   Labs (all labs ordered are listed, but only abnormal results are displayed) Labs Reviewed  BASIC METABOLIC PANEL - Abnormal; Notable for the following components:      Result Value   Glucose, Bld 147 (*)    All other components within normal limits  CBC - Abnormal; Notable for the following components:   WBC 12.2 (*)    All other components within normal limits    EKG None  Radiology DG Chest 2 View  Result Date: 05/14/2020 CLINICAL DATA:  79 year old male with shortness of breath. EXAM: CHEST - 2 VIEW COMPARISON:  Chest radiograph dated 05/08/2020. FINDINGS: Mild chronic bronchitic changes. No focal consolidation, pleural effusion, pneumothorax. Stable top-normal cardiac size. The  aorta is tortuous. No acute osseous pathology. Degenerative changes of the spine. IMPRESSION: No active cardiopulmonary disease. Electronically Signed   By: Anner Crete M.D.   On: 05/14/2020 15:52    Procedures Procedures (including critical care time)  Medications Ordered in ED Medications  albuterol (VENTOLIN HFA) 108 (90 Base) MCG/ACT inhaler 4 puff (has no administration in time range)  albuterol (VENTOLIN HFA) 108 (90 Base) MCG/ACT inhaler 8 puff (8 puffs Inhalation Given 05/15/20 0202)  predniSONE (DELTASONE) tablet 60 mg (60 mg Oral Given 05/15/20 0203)  ipratropium (ATROVENT HFA) inhaler 2 puff (2 puffs Inhalation Given 05/15/20 0204)  benzonatate (TESSALON) capsule 100 mg (100 mg Oral Given 05/15/20 0204)    ED Course  I have reviewed the triage vital signs and the nursing notes.  Pertinent labs & imaging results that were available during my care of the patient were reviewed by me and considered in my medical decision making (see chart for details).    MDM Rules/Calculators/A&P                         Likely asthma exacerbation but was also recently diagnosed with Covid will hold on nebulized treatments to avoid the spread of Covid and try to use an inhaler and steroids instead. Low suspicion for PE at this point but if not proving with inhalers as expected may need a CT scan. No obvious consolidation on x-rays, fever or purulent sputum to suggest superimposed bacterial infection.  Improved breathing to the point he was able to rest comfortably. Still with mild expiratory wheezing but sounds improved and he feels significantly improved as well. Steroids given. No abx indicated. Will send home on burst.   Final Clinical Impression(s) / ED Diagnoses Final diagnoses:  Cough  Dyspnea, unspecified type    Rx / DC Orders ED Discharge Orders         Ordered    predniSONE (DELTASONE) 20 MG tablet     Discontinue  Reprint     05/15/20 0539           Dio Giller, Corene Cornea,  MD 05/15/20 [phone]

## 2020-05-15 NOTE — ED Notes (Signed)
Still wheezing but he feels better

## 2020-12-31 ENCOUNTER — Other Ambulatory Visit: Payer: Self-pay | Admitting: Internal Medicine

## 2020-12-31 DIAGNOSIS — Q2546 Tortuous aortic arch: Secondary | ICD-10-CM

## 2021-01-17 ENCOUNTER — Other Ambulatory Visit: Payer: Medicare Other

## 2021-02-28 ENCOUNTER — Other Ambulatory Visit: Payer: Self-pay

## 2021-02-28 ENCOUNTER — Emergency Department (HOSPITAL_COMMUNITY)
Admission: EM | Admit: 2021-02-28 | Discharge: 2021-02-28 | Disposition: A | Discharge status: Home / Self Care | Payer: Medicare (Managed Care) | Attending: Emergency Medicine | Admitting: Emergency Medicine

## 2021-02-28 ENCOUNTER — Emergency Department (HOSPITAL_COMMUNITY): Payer: Medicare (Managed Care)

## 2021-02-28 DIAGNOSIS — I10 Essential (primary) hypertension: Secondary | ICD-10-CM | POA: Insufficient documentation

## 2021-02-28 DIAGNOSIS — Z87891 Personal history of nicotine dependence: Secondary | ICD-10-CM | POA: Insufficient documentation

## 2021-02-28 DIAGNOSIS — J4521 Mild intermittent asthma with (acute) exacerbation: Secondary | ICD-10-CM | POA: Diagnosis not present

## 2021-02-28 DIAGNOSIS — R0602 Shortness of breath: Secondary | ICD-10-CM | POA: Diagnosis present

## 2021-02-28 DIAGNOSIS — J441 Chronic obstructive pulmonary disease with (acute) exacerbation: Secondary | ICD-10-CM

## 2021-02-28 DIAGNOSIS — Z20822 Contact with and (suspected) exposure to covid-19: Secondary | ICD-10-CM | POA: Diagnosis not present

## 2021-02-28 LAB — CBC WITH DIFFERENTIAL/PLATELET
Abs Immature Granulocytes: 0.03 10*3/uL (ref 0.00–0.07)
Basophils Absolute: 0.1 10*3/uL (ref 0.0–0.1)
Basophils Relative: 1 %
Eosinophils Absolute: 0.5 10*3/uL (ref 0.0–0.5)
Eosinophils Relative: 5 %
HCT: 42.2 % (ref 39.0–52.0)
Hemoglobin: 13.6 g/dL (ref 13.0–17.0)
Immature Granulocytes: 0 %
Lymphocytes Relative: 17 %
Lymphs Abs: 1.5 10*3/uL (ref 0.7–4.0)
MCH: 31.9 pg (ref 26.0–34.0)
MCHC: 32.2 g/dL (ref 30.0–36.0)
MCV: 98.8 fL (ref 80.0–100.0)
Monocytes Absolute: 0.6 10*3/uL (ref 0.1–1.0)
Monocytes Relative: 7 %
Neutro Abs: 6.3 10*3/uL (ref 1.7–7.7)
Neutrophils Relative %: 70 %
Platelets: 255 10*3/uL (ref 150–400)
RBC: 4.27 MIL/uL (ref 4.22–5.81)
RDW: 14.6 % (ref 11.5–15.5)
WBC: 9 10*3/uL (ref 4.0–10.5)
nRBC: 0 % (ref 0.0–0.2)

## 2021-02-28 LAB — COMPREHENSIVE METABOLIC PANEL
ALT: 16 U/L (ref 0–44)
AST: 19 U/L (ref 15–41)
Albumin: 4.1 g/dL (ref 3.5–5.0)
Alkaline Phosphatase: 66 U/L (ref 38–126)
Anion gap: 7 (ref 5–15)
BUN: 12 mg/dL (ref 8–23)
CO2: 30 mmol/L (ref 22–32)
Calcium: 9.2 mg/dL (ref 8.9–10.3)
Chloride: 100 mmol/L (ref 98–111)
Creatinine, Ser: 0.72 mg/dL (ref 0.61–1.24)
GFR, Estimated: >60 mL/min (ref >60)
Glucose, Bld: 139 mg/dL — ABNORMAL HIGH (ref 70–99)
Potassium: 4.3 mmol/L (ref 3.5–5.1)
Sodium: 137 mmol/L (ref 135–145)
Total Bilirubin: 0.9 mg/dL (ref 0.3–1.2)
Total Protein: 7.7 g/dL (ref 6.5–8.1)

## 2021-02-28 LAB — RESP PANEL BY RT-PCR (FLU A&B, COVID) ARPGX2
Influenza A by PCR: NEGATIVE
Influenza B by PCR: NEGATIVE
SARS Coronavirus 2 by RT PCR: NEGATIVE

## 2021-02-28 LAB — TROPONIN I (HIGH SENSITIVITY)
Troponin I (High Sensitivity): 10 ng/L (ref <18)
Troponin I (High Sensitivity): 11 ng/L (ref <18)

## 2021-02-28 MED ORDER — ALBUTEROL SULFATE HFA 108 (90 BASE) MCG/ACT IN AERS
4.0000 | INHALATION_SPRAY | Freq: Once | RESPIRATORY_TRACT | Status: AC
  Administered 2021-02-28: 4 via RESPIRATORY_TRACT
  Filled 2021-02-28: qty 6.7

## 2021-02-28 MED ORDER — ALBUTEROL SULFATE HFA 108 (90 BASE) MCG/ACT IN AERS
2.0000 | INHALATION_SPRAY | Freq: Once | RESPIRATORY_TRACT | Status: AC
  Administered 2021-02-28: 2 via RESPIRATORY_TRACT
  Filled 2021-02-28: qty 6.7

## 2021-02-28 MED ORDER — AEROCHAMBER PLUS FLO-VU MEDIUM MISC
1.0000 | Freq: Once | Status: AC
  Administered 2021-02-28: 1
  Filled 2021-02-28: qty 1

## 2021-02-28 MED ORDER — MAGNESIUM SULFATE 2 GM/50ML IV SOLN
2.0000 g | Freq: Once | INTRAVENOUS | Status: AC
  Administered 2021-02-28: 2 g via INTRAVENOUS
  Filled 2021-02-28: qty 50

## 2021-02-28 MED ORDER — METHYLPREDNISOLONE SODIUM SUCC 125 MG IJ SOLR
125.0000 mg | Freq: Once | INTRAMUSCULAR | Status: AC
  Administered 2021-02-28: 125 mg via INTRAVENOUS
  Filled 2021-02-28: qty 2

## 2021-02-28 MED ORDER — ALBUTEROL SULFATE HFA 108 (90 BASE) MCG/ACT IN AERS: 1.0000 | INHALATION_SPRAY | Freq: Four times a day (QID) | RESPIRATORY_TRACT | 0 refills | Status: DC | PRN

## 2021-02-28 MED ORDER — PREDNISONE 20 MG PO TABS
40.0000 mg | ORAL_TABLET | Freq: Every day | ORAL | 0 refills | Status: AC
Start: 2021-02-28 — End: 2021-03-05

## 2021-02-28 MED ORDER — ALBUTEROL SULFATE (2.5 MG/3ML) 0.083% IN NEBU
2.5000 mg | INHALATION_SOLUTION | Freq: Once | RESPIRATORY_TRACT | Status: AC
  Administered 2021-02-28: 2.5 mg via RESPIRATORY_TRACT
  Filled 2021-02-28: qty 3

## 2021-02-28 NOTE — ED Provider Notes (Signed)
  Face-to-face evaluation   History: He presents for evaluation of shortness of breath for 1 week despite using albuterol and ipratropium inhalers.  He denies fever, cough, nausea, vomiting, weakness or dizziness.  Physical exam: Alert elderly male.  He reports feeling better after initial treatment.  Lungs with decreased airflow bilaterally and scattered rhonchi and wheezes.  There is no increased work of breathing.  He is not tachycardic.  Extremities are without edema.  Reevaluation, 9:25 PM.  Patient states he feels better is ready to go home.  Patient's wife with him and is agreeable.  He has normal oxygenation on room air.  No respiratory distress at this time.  There is fair air movement in the lung bilaterally.  Will be discharged with prescriptions for albuterol inhaler and prednisone.  Recommend to see PCP in 1 week or as needed.  Medical screening examination/treatment/procedure(s) were conducted as a shared visit with non-physician practitioner(s) and myself.  I personally evaluated the patient during the encounter    Mancel Bale, MD 02/28/21 2303

## 2021-02-28 NOTE — ED Triage Notes (Signed)
Emergency Medicine Provider Triage Evaluation Note  Edward Hurst , Hurst 80 y.o. male  was evaluated in triage.  Pt complains of shortness of breath and cough.  Cough productive of yellow sputum.  Occasionally has chest pain.  Has history of COPD.  No recent tobacco use.  Uses 2 different inhalers.  Has been using every 4 hours.  Vaccinated for Covid.  No recent Covid exposures.  No lower extremity edema.  Chest pain does not radiate to left arm, left back or jaw.  He has no current chest pain.  Review of Systems  Positive: Shortness of breath, cough, congestion, chest pain Negative:  dizziness, fever, chills  Physical Exam  BP (!) 194/110 (BP Location: Right Arm)   Pulse 95   Temp 98.6 F (37 C) (Oral)   Resp (!) 30   SpO2 97%  Gen:   Awake, no distress   HEENT:  Atraumatic  Resp:  Normal effort, diffuse expiratory wheeze bilaterally, speaks in full sentences Cardiac:  Normal rate  Abd:   Nondistended, nontender  MSK:   Moves extremities without difficulty  Neuro:  Speech clear   Medical Decision Making  Medically screening exam initiated at 12:56 PM.  Appropriate orders placed.  Edward Hurst was informed that the remainder of the evaluation will be completed by another provider, this initial triage assessment does not replace that evaluation, and the importance of remaining in the ED until their evaluation is complete.  Clinical Impression  Cough, SOB   Edward Isidore A, PA-C 02/28/21 1300

## 2021-02-28 NOTE — ED Triage Notes (Signed)
C/o cough and congestion x 1 week; concern for PNA. Stated he also feels short of breath more than usual.

## 2021-02-28 NOTE — ED Notes (Signed)
Called patient to update V/s, unable to locate at this time.

## 2021-02-28 NOTE — ED Provider Notes (Signed)
[facility] EMERGENCY DEPARTMENT Provider Note   CSN: [id number] Arrival date & time: 02/28/21  1245     History Chief Complaint  Patient presents with  . Shortness of Breath    Edward Hurst is a 80 y.o. male with a past medical history significant for asthma and hypertension who presents to the ED due to shortness of breath and productive cough x1 week.  Patient admits to a productive cough with white phlegm which is worse at night.  Shortness of breath worse with exertion and associated with wheeze.  No fever or chills.  No sick contacts or known Covid exposures.  He has received both his COVID-19 vaccinations however, not his booster shot.  Admits to intermittent central, nonradiating chest pain.  Last episode earlier this morning.  No relation to exertion.  Chest pain worse when coughing.  Patient states he typically has asthma exacerbations due to seasonal allergies.  Denies history of blood clots, recent surgeries or recent long immobilizations, hormonal treatments, hemoptysis, lower extremity edema. He has been using albuterol and Atrovent with no relief.  History obtained from patient, wife, and past medical records. No interpreter used during encounter.      Past Medical History:  Diagnosis Date  . Asthma   . Hypertension     There are no problems to display for this patient.   No past surgical history on file.     No family history on file.  Social History   Tobacco Use  . Smoking status: Former Games developer  . Smokeless tobacco: Never Used  Substance Use Topics  . Alcohol use: Not Currently  . Drug use: Never    Home Medications Prior to Admission medications   Medication Sig Start Date End Date Taking? Authorizing Provider  albuterol (VENTOLIN HFA) 108 (90 Base) MCG/ACT inhaler Inhale 1-2 puffs into the lungs every 6 (six) hours as needed for wheezing or shortness of breath. 02/28/21  Yes Carlethia Mesquita, Merla Riches, PA-C  predniSONE (DELTASONE) 20 MG  tablet Take 2 tablets (40 mg total) by mouth daily for 5 days. 02/28/21 03/05/21 Yes Wilfrido Luedke, Merla Riches, PA-C  albuterol (VENTOLIN HFA) 108 (90 Base) MCG/ACT inhaler Inhale 2 puffs into the lungs every 4 (four) hours as needed for wheezing or shortness of breath. 05/02/20   Janeece Fitting, MD  benzonatate (TESSALON) 100 MG capsule Take 1 capsule (100 mg total) by mouth 2 (two) times daily as needed for cough. 05/08/20   Caccavale, Sophia, PA-C  predniSONE (DELTASONE) 20 MG tablet 3 tabs po day one, then 2 po daily x 4 days 05/15/20   Mesner, Barbara Cower, MD    Allergies    Patient has no known allergies.  Review of Systems   Review of Systems  Constitutional: Negative for chills and fever.  Respiratory: Positive for cough and shortness of breath.   Cardiovascular: Positive for chest pain. Negative for leg swelling.  Gastrointestinal: Negative for abdominal pain, diarrhea, nausea and vomiting.  All other systems reviewed and are negative.   Physical Exam Updated Vital Signs BP (!) 144/84   Pulse 79   Temp 98.6 F (37 C) (Oral)   Resp (!) 24   SpO2 99%   Physical Exam Vitals and nursing note reviewed.  Constitutional:      General: He is not in acute distress.    Appearance: He is not ill-appearing.  HENT:     Head: Normocephalic.  Eyes:     Pupils: Pupils are equal, round, and reactive to light.  Cardiovascular:     Rate and Rhythm: Normal rate and regular rhythm.     Pulses: Normal pulses.     Heart sounds: Normal heart sounds. No murmur heard. No friction rub. No gallop.   Pulmonary:     Effort: Pulmonary effort is normal.     Breath sounds: Wheezing and rhonchi present.     Comments: No accessory muscle usage.  Patient speaking in broken up sentences. Abdominal:     General: Abdomen is flat. There is no distension.     Palpations: Abdomen is soft.     Tenderness: There is no abdominal tenderness. There is no guarding or rebound.  Musculoskeletal:        General: Normal range  of motion.     Cervical back: Neck supple.     Comments: No lower extremity edema. Negative homan sign bilaterally.  Skin:    General: Skin is warm and dry.  Neurological:     General: No focal deficit present.     Mental Status: He is alert.  Psychiatric:        Mood and Affect: Mood normal.        Behavior: Behavior normal.     ED Results / Procedures / Treatments   Labs (all labs ordered are listed, but only abnormal results are displayed) Labs Reviewed  COMPREHENSIVE METABOLIC PANEL - Abnormal; Notable for the following components:      Result Value   Glucose, Bld 139 (*)    All other components within normal limits  RESP PANEL BY RT-PCR (FLU A&B, COVID) ARPGX2  CBC WITH DIFFERENTIAL/PLATELET  TROPONIN I (HIGH SENSITIVITY)  TROPONIN I (HIGH SENSITIVITY)    EKG EKG Interpretation  Date/Time:  Monday February 28 2021 12:52:32 EDT Ventricular Rate:  92 PR Interval:  172 QRS Duration: 78 QT Interval:  340 QTC Calculation: 420 R Axis:   71 Text Interpretation: Normal sinus rhythm with sinus arrhythmia Normal ECG since last tracing no significant change Confirmed by Mancel Bale [phone]) on 02/28/2021 4:35:45 PM   Radiology DG Chest 2 View  Result Date: 02/28/2021 CLINICAL DATA:  Cough and wheezing EXAM: CHEST - 2 VIEW COMPARISON:  05/14/2020 FINDINGS: The heart size and mediastinal contours are within normal limits. Both lungs are clear. The visualized skeletal structures are unremarkable. IMPRESSION: No active cardiopulmonary disease. Electronically Signed   By: Acquanetta Belling M.D.   On: 02/28/2021 14:33    Procedures Procedures   Medications Ordered in ED Medications  albuterol (PROVENTIL) (2.5 MG/3ML) 0.083% nebulizer solution 2.5 mg (has no administration in time range)  albuterol (VENTOLIN HFA) 108 (90 Base) MCG/ACT inhaler 2 puff (2 puffs Inhalation Given 02/28/21 1304)  methylPREDNISolone sodium succinate (SOLU-MEDROL) 125 mg/2 mL injection 125 mg (125 mg  Intravenous Given 02/28/21 1550)  albuterol (VENTOLIN HFA) 108 (90 Base) MCG/ACT inhaler 4 puff (4 puffs Inhalation Given 02/28/21 1550)  AeroChamber Plus Flo-Vu Medium MISC 1 each (1 each Other Given 02/28/21 1500)  magnesium sulfate IVPB 2 g 50 mL (0 g Intravenous Stopped 02/28/21 1845)    ED Course  I have reviewed the triage vital signs and the nursing notes.  Pertinent labs & imaging results that were available during my care of the patient were reviewed by me and considered in my medical decision making (see chart for details).    MDM Rules/Calculators/A&P                         79 year old male with  history of asthma presents to the ED due to shortness of breath and productive cough x1 week.  No sick contacts known Covid exposures.  He is currently vaccinated against COVID-19.  Upon arrival, patient afebrile, not tachycardic or hypoxic.  Patient's BP elevated at 194/110 and patient tachypneic.  Patient in no acute distress.  Lungs with expiratory wheeze and rhonchi.  No accessory muscle usage.  Patient speaking in broken up sentences.  Patient notes mild improvement after albuterol given in triage.  Labs and x-ray ordered at triage.  IV Solu-Medrol and or albuterol given.  Suspect symptoms related to asthma exacerbation.  Covid test ordered to rule out infection.  Low suspicion for PE given no tachycardia or hypoxia.  No clinical signs of DVT on exam.   CBC unremarkable no leukocytosis and normal hemoglobin.  CMP reassuring with mild hyperglycemia 139.  No anion gap.  Normal renal function.  No major electrolyte derangements.  Chest x-ray personally reviewed which is negative for signs of pneumonia, pneumothorax, widened mediastinum.  EKG personally reviewed which demonstrates normal sinus rhythm with sinus arrhythmia.  No signs of acute ischemia. Delta troponin flat. Doubt ACS.  4:46 PM reassessed patient at bedside who notes improvement in breathing. Lungs still with persistent wheeze. IV  magnesium ordered. COVID test ordered. Will give nebulizer treatment once COVID test results.   Patient handed off to Dr. Effie Shy pending improvement in breath after nebulizer treatment.  Final Clinical Impression(s) / ED Diagnoses Final diagnoses:  Mild intermittent asthma with exacerbation    Rx / DC Orders ED Discharge Orders         Ordered    predniSONE (DELTASONE) 20 MG tablet  Daily        02/28/21 1909    albuterol (VENTOLIN HFA) 108 (90 Base) MCG/ACT inhaler  Every 6 hours PRN        02/28/21 1909           Jesusita Oka 02/28/21 1910    Mancel Bale, MD 02/28/21 2303

## 2021-02-28 NOTE — ED Notes (Signed)
Assumed care of this patient. Vitals taken. A&Ox4. Respirations regular/unlabored. Connected to cardiac monitor, bp ,and pulse ox. Stretcher low, wheels locked call bell within reach. Family at bedside.

## 2021-02-28 NOTE — Discharge Instructions (Addendum)
As discussed, I suspect your symptoms are related to an asthma exacerbation. I am sending you home with steroids and a refill on your albuterol.  Please follow-up with PCP within the next week for further evaluation.  Return to the ER for new or worsening symptoms.  Use the albuterol inhaler, 2 puffs every 2-3 hours as needed for cough or trouble breathing.  Use the Atrovent inhaler, 2 puffs, 4 times a day.

## 2021-03-15 ENCOUNTER — Emergency Department (HOSPITAL_COMMUNITY): Payer: Medicare (Managed Care)

## 2021-03-15 ENCOUNTER — Other Ambulatory Visit: Payer: Self-pay

## 2021-03-15 ENCOUNTER — Emergency Department (HOSPITAL_COMMUNITY)
Admission: EM | Admit: 2021-03-15 | Discharge: 2021-03-15 | Disposition: A | Discharge status: Home / Self Care | Payer: Medicare (Managed Care) | Attending: Emergency Medicine | Admitting: Emergency Medicine

## 2021-03-15 ENCOUNTER — Encounter (HOSPITAL_COMMUNITY): Payer: Self-pay | Admitting: Emergency Medicine

## 2021-03-15 DIAGNOSIS — I10 Essential (primary) hypertension: Secondary | ICD-10-CM | POA: Insufficient documentation

## 2021-03-15 DIAGNOSIS — Z87891 Personal history of nicotine dependence: Secondary | ICD-10-CM | POA: Insufficient documentation

## 2021-03-15 DIAGNOSIS — Z20822 Contact with and (suspected) exposure to covid-19: Secondary | ICD-10-CM | POA: Insufficient documentation

## 2021-03-15 DIAGNOSIS — R0602 Shortness of breath: Secondary | ICD-10-CM | POA: Diagnosis present

## 2021-03-15 DIAGNOSIS — J4541 Moderate persistent asthma with (acute) exacerbation: Secondary | ICD-10-CM | POA: Insufficient documentation

## 2021-03-15 DIAGNOSIS — J45901 Unspecified asthma with (acute) exacerbation: Secondary | ICD-10-CM

## 2021-03-15 LAB — BASIC METABOLIC PANEL
Anion gap: 6 (ref 5–15)
BUN: 12 mg/dL (ref 8–23)
CO2: 28 mmol/L (ref 22–32)
Calcium: 9.1 mg/dL (ref 8.9–10.3)
Chloride: 103 mmol/L (ref 98–111)
Creatinine, Ser: 0.87 mg/dL (ref 0.61–1.24)
GFR, Estimated: >60 mL/min (ref >60)
Glucose, Bld: 148 mg/dL — ABNORMAL HIGH (ref 70–99)
Potassium: 4 mmol/L (ref 3.5–5.1)
Sodium: 137 mmol/L (ref 135–145)

## 2021-03-15 LAB — CBC WITH DIFFERENTIAL/PLATELET
Abs Immature Granulocytes: 0.03 10*3/uL (ref 0.00–0.07)
Basophils Absolute: 0.1 10*3/uL (ref 0.0–0.1)
Basophils Relative: 1 %
Eosinophils Absolute: 0.7 10*3/uL — ABNORMAL HIGH (ref 0.0–0.5)
Eosinophils Relative: 7 %
HCT: 39.4 % (ref 39.0–52.0)
Hemoglobin: 12.3 g/dL — ABNORMAL LOW (ref 13.0–17.0)
Immature Granulocytes: 0 %
Lymphocytes Relative: 16 %
Lymphs Abs: 1.4 10*3/uL (ref 0.7–4.0)
MCH: 31.3 pg (ref 26.0–34.0)
MCHC: 31.2 g/dL (ref 30.0–36.0)
MCV: 100.3 fL — ABNORMAL HIGH (ref 80.0–100.0)
Monocytes Absolute: 0.6 10*3/uL (ref 0.1–1.0)
Monocytes Relative: 6 %
Neutro Abs: 6.4 10*3/uL (ref 1.7–7.7)
Neutrophils Relative %: 70 %
Platelets: 264 10*3/uL (ref 150–400)
RBC: 3.93 MIL/uL — ABNORMAL LOW (ref 4.22–5.81)
RDW: 14.6 % (ref 11.5–15.5)
WBC: 9.2 10*3/uL (ref 4.0–10.5)
nRBC: 0 % (ref 0.0–0.2)

## 2021-03-15 LAB — POC SARS CORONAVIRUS 2 AG -  ED: SARSCOV2ONAVIRUS 2 AG: NEGATIVE

## 2021-03-15 MED ORDER — METHYLPREDNISOLONE SODIUM SUCC 125 MG IJ SOLR
125.0000 mg | Freq: Once | INTRAMUSCULAR | Status: AC
  Administered 2021-03-15: 125 mg via INTRAVENOUS
  Filled 2021-03-15: qty 2

## 2021-03-15 MED ORDER — IPRATROPIUM-ALBUTEROL 0.5-2.5 (3) MG/3ML IN SOLN
3.0000 mL | Freq: Once | RESPIRATORY_TRACT | Status: AC
  Administered 2021-03-15: 3 mL via RESPIRATORY_TRACT
  Filled 2021-03-15: qty 3

## 2021-03-15 MED ORDER — PREDNISONE 10 MG (21) PO TBPK: ORAL_TABLET | Freq: Every day | ORAL | 0 refills | Status: DC

## 2021-03-15 MED ORDER — ALBUTEROL SULFATE HFA 108 (90 BASE) MCG/ACT IN AERS
1.0000 | INHALATION_SPRAY | Freq: Once | RESPIRATORY_TRACT | Status: AC
  Administered 2021-03-15: 2 via RESPIRATORY_TRACT
  Filled 2021-03-15: qty 6.7

## 2021-03-15 MED ORDER — ALBUTEROL SULFATE HFA 108 (90 BASE) MCG/ACT IN AERS: 1.0000 | INHALATION_SPRAY | Freq: Four times a day (QID) | RESPIRATORY_TRACT | 0 refills | Status: AC | PRN

## 2021-03-15 MED ORDER — MAGNESIUM SULFATE 2 GM/50ML IV SOLN
2.0000 g | Freq: Once | INTRAVENOUS | Status: AC
  Administered 2021-03-15: 2 g via INTRAVENOUS
  Filled 2021-03-15: qty 50

## 2021-03-15 NOTE — Discharge Instructions (Addendum)
Take prednisone as prescribed.  Use albuterol inhalers.  As we discussed, we will give you a referral to pulmonology.  Please follow-up with them as directed.  Return to emergency department for any worsening difficulty breathing, chest pain, vomiting or any other worsening concerning symptoms.

## 2021-03-15 NOTE — ED Notes (Signed)
ambulate patient in hallway sats 98% HR-88 BACK TO BED

## 2021-03-15 NOTE — ED Triage Notes (Signed)
Pt c/o worsening shortness of breath, cough and occasional chest pain. Pt recently treated with prednisone, reports shortness of breath worsened once meds were finished.

## 2021-03-15 NOTE — ED Provider Notes (Signed)
[facility] EMERGENCY DEPARTMENT Provider Note   CSN: [id number] Arrival date & time: 03/15/21  0017     History Chief Complaint  Patient presents with  . Shortness of Breath    Edward Hurst is a 80 y.o. male with PMH/o asthma, HTN who presents for evaluation of SOB.  He was seen here on 02/28/2021 for evaluation of similar symptoms.  He received medications and does report that he had some improvement in symptoms.  He was discharged home with inhalers and steroids.  He states that he never got back to fully normal and states that he has continued to have some intermittent shortness of breath.  Uses the inhalers and states that it helps temporarily but then the symptoms returned quickly.  Tonight, his breathing was getting worse, prompting ED visit.  He has not any fevers.  He has not had a cough.  He states that sometimes when he coughs, his chest is sore but otherwise denies any chest pain.  Denies any abdominal pain, nausea/vomiting.  He has been vaccinated for COVID x2.  No COVID exposure that he knows of.  He does not smoke.  He has never had to be intubated or admitted for his asthma.  The history is provided by the patient.       Past Medical History:  Diagnosis Date  . Asthma   . Hypertension     There are no problems to display for this patient.   History reviewed. No pertinent surgical history.     No family history on file.  Social History   Tobacco Use  . Smoking status: Former Games developer  . Smokeless tobacco: Never Used  Substance Use Topics  . Alcohol use: Not Currently  . Drug use: Never    Home Medications Prior to Admission medications   Medication Sig Start Date End Date Taking? Authorizing Provider  albuterol (VENTOLIN HFA) 108 (90 Base) MCG/ACT inhaler Inhale 1-2 puffs into the lungs every 6 (six) hours as needed for wheezing or shortness of breath. 03/15/21  Yes Graciella Freer A, PA-C  predniSONE (STERAPRED UNI-PAK 21 TAB) 10 MG (21)  TBPK tablet Take by mouth daily. Take 6 tabs by mouth daily  for 2 days, then 5 tabs for 2 days, then 4 tabs for 2 days, then 3 tabs for 2 days, 2 tabs for 2 days, then 1 tab by mouth daily for 2 days 03/15/21  Yes Graciella Freer A, PA-C  benzonatate (TESSALON) 100 MG capsule Take 1 capsule (100 mg total) by mouth 2 (two) times daily as needed for cough. 05/08/20   Caccavale, Sophia, PA-C    Allergies    Patient has no known allergies.  Review of Systems   Review of Systems  Constitutional: Negative for fever.  Respiratory: Positive for cough, shortness of breath and wheezing.   Cardiovascular: Negative for chest pain.  Gastrointestinal: Negative for abdominal pain, nausea and vomiting.  Genitourinary: Negative for dysuria and hematuria.  Neurological: Negative for headaches.  All other systems reviewed and are negative.   Physical Exam Updated Vital Signs BP (!) 166/96 (BP Location: Left Arm)   Pulse 84   Temp 98 F (36.7 C)   Resp (!) 26   Ht 5\' 9"  (1.753 m)   Wt 77.1 kg   SpO2 100%   BMI 25.10 kg/m   Physical Exam Vitals and nursing note reviewed.  Constitutional:      Appearance: Normal appearance. He is well-developed.  HENT:  Head: Normocephalic and atraumatic.  Eyes:     General: Lids are normal.     Conjunctiva/sclera: Conjunctivae normal.     Pupils: Pupils are equal, round, and reactive to light.  Cardiovascular:     Rate and Rhythm: Normal rate and regular rhythm.     Pulses: Normal pulses.     Heart sounds: Normal heart sounds. No murmur heard. No friction rub. No gallop.   Pulmonary:     Effort: Pulmonary effort is normal.     Breath sounds: Decreased air movement present. Wheezing present.     Comments: Patient speaking short to medium sentences.  Diffuse wheezing noted throughout all lung fields.  Decreased air movement noted. Abdominal:     Palpations: Abdomen is soft. Abdomen is not rigid.     Tenderness: There is no abdominal tenderness. There  is no guarding.  Musculoskeletal:        General: Normal range of motion.     Cervical back: Full passive range of motion without pain.  Skin:    General: Skin is warm and dry.     Capillary Refill: Capillary refill takes less than 2 seconds.  Neurological:     Mental Status: He is alert and oriented to person, place, and time.  Psychiatric:        Speech: Speech normal.     ED Results / Procedures / Treatments   Labs (all labs ordered are listed, but only abnormal results are displayed) Labs Reviewed  BASIC METABOLIC PANEL - Abnormal; Notable for the following components:      Result Value   Glucose, Bld 148 (*)    All other components within normal limits  CBC WITH DIFFERENTIAL/PLATELET - Abnormal; Notable for the following components:   RBC 3.93 (*)    Hemoglobin 12.3 (*)    MCV 100.3 (*)    Eosinophils Absolute 0.7 (*)    All other components within normal limits  POC SARS CORONAVIRUS 2 AG -  ED    EKG None  Radiology DG Chest 2 View  Result Date: 03/15/2021 CLINICAL DATA:  Dyspnea EXAM: CHEST - 2 VIEW COMPARISON:  02/28/2021 FINDINGS: Lungs are clear. No pneumothorax or pleural effusion. Cardiac size is mildly enlarged, unchanged. Pulmonary vascularity is normal. No acute bone abnormality. Degenerative changes are noted within the shoulders. IMPRESSION: No active cardiopulmonary disease.  Stable cardiomegaly. Electronically Signed   By: Helyn Numbers MD   On: 03/15/2021 00:52    Procedures Procedures   Medications Ordered in ED Medications  albuterol (VENTOLIN HFA) 108 (90 Base) MCG/ACT inhaler 1-2 puff (2 puffs Inhalation Given 03/15/21 0226)  methylPREDNISolone sodium succinate (SOLU-MEDROL) 125 mg/2 mL injection 125 mg (125 mg Intravenous Given 03/15/21 0234)  ipratropium-albuterol (DUONEB) 0.5-2.5 (3) MG/3ML nebulizer solution 3 mL (3 mLs Nebulization Given 03/15/21 0226)  magnesium sulfate IVPB 2 g 50 mL (0 g Intravenous Stopped 03/15/21 0534)    ED Course   I have reviewed the triage vital signs and the nursing notes.  Pertinent labs & imaging results that were available during my care of the patient were reviewed by me and considered in my medical decision making (see chart for details).    MDM Rules/Calculators/A&P                          80 year old male with past ministry of asthma who presents for evaluation of shortness of breath, cough.  Was seen here on 02/28/2021 for evaluation of same  symptoms.  Was prescribed prednisone which she finished a few days ago.  Has continued to have symptoms, prompting ED visit.  On initial arrival, he is afebrile, but is tachypneic.  He is speaking in short to medium sentences.  Notable wheezing noted.  We will plan for chest x-ray, albuterol, steroids.   Chest x-ray shows no evidence of acute abnormality.  BMP shows normal BUN/creatinine.  CBC shows hemoglobin 12.3.  COVID is negative.  Reevaluation after albuterol, DuoNeb, prednisone.  Patient sounds better and is able to speak in longer sentences.  He on reevaluation, he still has wheezing noted throughout all lung fields.  Will give magnesium.  Reevaluation after magnesium.  Patient is resting and states he does feel better.  He still has some residual wheezing noted but appears to be able to talk more comfortably.  He is not hypoxic.  Patient ambulated in the ED while maintaining O2 sats of 98%.  Patient appears much more comfortable and is able to speak in longer sentences.  He states he feels better.  I had a long extensive discussion with both him and his wife and offered him admission given that this is his second time and he still has some residual wheezing.  Patient reports he feels better and would prefer to go home.  At this time, patient is hemodynamically stable without any signs of hypoxia.  He got a small prednisone burst last time he is here.  We will give him a prednisone taper.  Additionally, we will plan to have him referred to pulmonary for  evaluation. At this time, patient exhibits no emergent life-threatening condition that require further evaluation in ED. Discussed patient with Dr. Wilkie Aye who is agreeable to plan. Patient had ample opportunity for questions and discussion. All patient's questions were answered with full understanding. Strict return precautions discussed. Patient expresses understanding and agreement to plan.   Portions of this note were generated with Scientist, clinical (histocompatibility and immunogenetics). Dictation errors may occur despite best attempts at proofreading.   Final Clinical Impression(s) / ED Diagnoses Final diagnoses:  Asthma with acute exacerbation, unspecified asthma severity, unspecified whether persistent    Rx / DC Orders ED Discharge Orders         Ordered    predniSONE (STERAPRED UNI-PAK 21 TAB) 10 MG (21) TBPK tablet  Daily        03/15/21 0551    albuterol (VENTOLIN HFA) 108 (90 Base) MCG/ACT inhaler  Every 6 hours PRN        03/15/21 0551           Maxwell Caul, PA-C 03/15/21 9628    Shon Baton, MD 03/15/21 [phone]

## 2021-03-15 NOTE — ED Provider Notes (Signed)
MSE was initiated and I personally evaluated the patient and placed orders (if any) at  12:33 AM on March 15, 2021.  The patient appears stable so that the remainder of the MSE may be completed by another provider.   Patient placed in Quick Look pathway, seen and evaluated   Chief Complaint: SOB   HPI:   80 year old male possible history of asthma who presents for evaluation of shortness of breath.  Was seen here last week for similar symptoms.  Reports he had improvement.  He was discharged with prednisone which she finished about 4 days ago.  He states that today, his shortness of breath returned and he felt like he was wheezing and coughing.  No fevers, chest pain.  ROS: Shortness of breath  Physical Exam:   Gen: No distress  Neuro: Awake and Alert  Skin: Warm    Focused Exam: Diffuse expiratory wheezing noted throughout all lung fields.  Able to speak in medium sentences.   Initiation of care has begun. The patient has been counseled on the process, plan, and necessity for staying for the completion/evaluation, and the remainder of the medical screening examination    Rosana Hoes 03/15/21 2633    Shon Baton, MD 03/15/21 [phone]

## 2021-04-12 ENCOUNTER — Encounter: Payer: Self-pay | Admitting: Pulmonary Disease

## 2021-04-12 ENCOUNTER — Ambulatory Visit: Payer: Medicare (Managed Care) | Admitting: Pulmonary Disease

## 2021-04-12 ENCOUNTER — Other Ambulatory Visit: Payer: Self-pay

## 2021-04-12 VITALS — BP 108/72 | HR 91 | Temp 97.8°F | Ht 69.0 in | Wt 171.6 lb

## 2021-04-12 DIAGNOSIS — J449 Chronic obstructive pulmonary disease, unspecified: Secondary | ICD-10-CM

## 2021-04-12 MED ORDER — ADVAIR HFA 230-21 MCG/ACT IN AERO: 2.0000 | INHALATION_SPRAY | Freq: Two times a day (BID) | RESPIRATORY_TRACT | 12 refills | Status: DC

## 2021-04-12 NOTE — Patient Instructions (Signed)
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

## 2021-04-12 NOTE — Progress Notes (Signed)
Subjective:   PATIENT ID: Edward Hurst GENDER: male DOB: 1941-07-09, MRN: [id number]   HPI  Chief Complaint  Patient presents with  . Consult    Reports that he has had increased coughing starting this year with greenish sputum, has increased use of inhaler.     Reason for Visit: New consult for coughing  [name] is a 80 year old male former smoker with asthma who presents as a new consult.  He reports history of asthma that would intermittently affect him since his 60s which resulted in quitting smoking. He would have shortness of breath, coughing and wheezing every year. Worse in the springtime, pollen and allergens. He has presented to the ED at least twice in the last few years however the last 12 months he has had presented to the ED five times requiring treatment with steroids. He uses his atrovent every 5-6 hours and albuterol every 4 hours. He has chronic cough with occasional yellow-green sputum and shortness of breath with exertion. Wheezing has resolved with inhaler use.  Social History: Quit 30 years ago. He was smoking 3 packs per day x 10 years. 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 x 20 years  I have personally reviewed patient's past medical/family/social history, allergies, current medications.  Past Medical History:  Diagnosis Date  . Asthma   . Chronic headache   . Hyperlipidemia   . Hypertension   . Sleep apnea      History reviewed. No pertinent family history.   Social History   Occupational History  . Not on file  Tobacco Use  . Smoking status: Former Smoker    Packs/day: 3.00    Years: 10.00    Pack years: 30.00    Types: Cigarettes    Quit date: 1981    Years since quitting: 41.4  . Smokeless tobacco: Never Used  Vaping Use  . Vaping Use: Never used  Substance and Sexual Activity  . Alcohol use: Not Currently  . Drug use: Never  . Sexual activity: Not on file     No Known Allergies   Outpatient Medications Prior to Visit  Medication Sig Dispense Refill  . albuterol (VENTOLIN HFA) 108 (90 Base) MCG/ACT inhaler Inhale 1-2 puffs into the lungs every 6 (six) hours as needed for wheezing or shortness of breath. 8 g 0  . Calcium Carb-Cholecalciferol (CALCIUM-VITAMIN D3) 600-500 MG-UNIT CAPS     . finasteride (PROSCAR) 5 MG tablet Take 5 mg by mouth daily.    Marland Kitchen ipratropium (ATROVENT HFA) 17 MCG/ACT inhaler     . ipratropium-albuterol (DUONEB) 0.5-2.5 (3) MG/3ML SOLN     . lisinopril (ZESTRIL) 20 MG tablet Take 1 tablet by mouth daily.    . montelukast (SINGULAIR) 10 MG tablet Take 1 tablet by mouth daily.    . sildenafil (VIAGRA) 100 MG tablet Take 100 mg by mouth daily as needed.    . tamsulosin (FLOMAX) 0.4 MG CAPS capsule Take 0.4 mg by mouth daily.    . verapamil (VERELAN PM) 240 MG 24 hr capsule Take 240 mg by mouth daily.    . Vitamin D, Ergocalciferol, (DRISDOL) 1.25 MG (50000 UNIT) CAPS capsule Take 1 capsule by mouth once a week.    . benzonatate (TESSALON) 100 MG capsule Take 1 capsule (100 mg total) by mouth 2 (two) times daily as needed for cough. (Patient not taking: Reported on 04/12/2021) 21 capsule 0  . predniSONE (STERAPRED UNI-PAK 21  TAB) 10 MG (21) TBPK tablet Take by mouth daily. Take 6 tabs by mouth daily  for 2 days, then 5 tabs for 2 days, then 4 tabs for 2 days, then 3 tabs for 2 days, 2 tabs for 2 days, then 1 tab by mouth daily for 2 days (Patient not taking: Reported on 04/12/2021) 42 tablet 0   No facility-administered medications prior to visit.    Review of Systems  Constitutional: Negative for chills, diaphoresis, fever, malaise/fatigue and weight loss.  HENT: Negative for congestion, ear pain and sore throat.   Respiratory: Positive for cough, shortness of breath and wheezing. Negative for hemoptysis and sputum production.   Cardiovascular: Negative for chest pain, palpitations and leg swelling.  Gastrointestinal:  Negative for abdominal pain, heartburn and nausea.  Genitourinary: Negative for frequency.  Musculoskeletal: Negative for joint pain and myalgias.  Skin: Negative for itching and rash.  Neurological: Negative for dizziness, weakness and headaches.  Endo/Heme/Allergies: Positive for environmental allergies. Does not bruise/bleed easily.  Psychiatric/Behavioral: Negative for depression. The patient is not nervous/anxious.      Objective:   Vitals:   04/12/21 1117  BP: 108/72  Pulse: 91  Temp: 97.8 F (36.6 C)  TempSrc: Temporal  SpO2: 97%  Weight: 171 lb 9.6 oz (77.8 kg)  Height: 5\' 9"  (1.753 m)   SpO2: 97 % (RA) O2 Device: None (Room air)  Physical Exam: General: Well-appearing, no acute distress HENT: [place], AT Eyes: EOMI, no scleral icterus Respiratory: Clear to auscultation bilaterally.  No crackles, wheezing or rales Cardiovascular: RRR, -M/R/G, no JVD Extremities:-Edema,-tenderness Neuro: AAO x4, CNII-XII grossly intact Skin: Intact, no rashes or bruising Psych: Normal mood, normal affect  Data Reviewed:  Imaging: CXR 03/11/21 - No acute abnormalities  PFT: None on file  Labs: CBC    Component Value Date/Time   WBC 9.2 03/15/2021 0041   RBC 3.93 (L) 03/15/2021 0041   HGB 12.3 (L) 03/15/2021 0041   HCT 39.4 03/15/2021 0041   PLT 264 03/15/2021 0041   MCV 100.3 (H) 03/15/2021 0041   MCH 31.3 03/15/2021 0041   MCHC 31.2 03/15/2021 0041   RDW 14.6 03/15/2021 0041   LYMPHSABS 1.4 03/15/2021 0041   MONOABS 0.6 03/15/2021 0041   EOSABS 0.7 (H) 03/15/2021 0041   BASOSABS 0.1 03/15/2021 0041   Absolute eos 04/12/21 - 700  Imaging, labs and test noted above have been reviewed independently by me.  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 Influenza, Quadrivalent, Recombinant, Inj, Pf 08/19/2020  . Moderna Sars-Covid-2 Vaccination 08/19/2020, 10/28/2020   CT Lung Screen - not indicated  Orders Placed This Encounter  Procedures  . Pulmonary Function Test    Standing Status:   Future    Standing Expiration Date:   04/12/2022    Order Specific Question:   Where should this test be performed?    Answer:   [facility]    Order Specific Question:   Full PFT: includes the following: basic spirometry, spirometry pre & post bronchodilator, diffusion capacity (DLCO), lung volumes    Answer:   Full PFT   Meds ordered this encounter  Medications  . fluticasone-salmeterol (ADVAIR HFA) 230-21 MCG/ACT inhaler    Sig: Inhale 2 puffs into the lungs 2 (two) times daily.    Dispense:  1 each  Refill:  12    Return in about 1 month (around 05/13/2021).  I have spent a total time of 45-minutes on the day of the appointment reviewing prior documentation, coordinating care and discussing medical diagnosis and plan with the patient/family. Imaging, labs and tests included in this note have been reviewed and interpreted independently by me.  Samariah Hokenson Mechele Collin, MD [facility] 04/12/2021 11:35 AM  Office Number [phone]

## 2021-04-27 ENCOUNTER — Encounter: Payer: Self-pay | Admitting: Pulmonary Disease

## 2021-05-23 ENCOUNTER — Emergency Department (HOSPITAL_COMMUNITY): Payer: Medicare Other

## 2021-05-23 ENCOUNTER — Other Ambulatory Visit: Payer: Self-pay

## 2021-05-23 ENCOUNTER — Emergency Department (HOSPITAL_COMMUNITY)
Admission: EM | Admit: 2021-05-23 | Discharge: 2021-05-23 | Disposition: A | Discharge status: Home / Self Care | Payer: Medicare Other | Attending: Emergency Medicine | Admitting: Emergency Medicine

## 2021-05-23 DIAGNOSIS — J441 Chronic obstructive pulmonary disease with (acute) exacerbation: Secondary | ICD-10-CM | POA: Diagnosis not present

## 2021-05-23 DIAGNOSIS — Z20822 Contact with and (suspected) exposure to covid-19: Secondary | ICD-10-CM | POA: Insufficient documentation

## 2021-05-23 DIAGNOSIS — I10 Essential (primary) hypertension: Secondary | ICD-10-CM | POA: Diagnosis not present

## 2021-05-23 DIAGNOSIS — Z87891 Personal history of nicotine dependence: Secondary | ICD-10-CM | POA: Diagnosis not present

## 2021-05-23 DIAGNOSIS — Z7951 Long term (current) use of inhaled steroids: Secondary | ICD-10-CM | POA: Insufficient documentation

## 2021-05-23 DIAGNOSIS — R0602 Shortness of breath: Secondary | ICD-10-CM | POA: Diagnosis present

## 2021-05-23 DIAGNOSIS — Z79899 Other long term (current) drug therapy: Secondary | ICD-10-CM | POA: Insufficient documentation

## 2021-05-23 DIAGNOSIS — J45909 Unspecified asthma, uncomplicated: Secondary | ICD-10-CM | POA: Insufficient documentation

## 2021-05-23 LAB — TROPONIN I (HIGH SENSITIVITY)
Troponin I (High Sensitivity): 8 ng/L (ref <18)
Troponin I (High Sensitivity): 11 ng/L (ref <18)

## 2021-05-23 LAB — CBC WITH DIFFERENTIAL/PLATELET
Abs Immature Granulocytes: 0.03 10*3/uL (ref 0.00–0.07)
Basophils Absolute: 0.1 10*3/uL (ref 0.0–0.1)
Basophils Relative: 1 %
Eosinophils Absolute: 0.3 10*3/uL (ref 0.0–0.5)
Eosinophils Relative: 4 %
HCT: 41.3 % (ref 39.0–52.0)
Hemoglobin: 13 g/dL (ref 13.0–17.0)
Immature Granulocytes: 0 %
Lymphocytes Relative: 11 %
Lymphs Abs: 1 10*3/uL (ref 0.7–4.0)
MCH: 31.9 pg (ref 26.0–34.0)
MCHC: 31.5 g/dL (ref 30.0–36.0)
MCV: 101.5 fL — ABNORMAL HIGH (ref 80.0–100.0)
Monocytes Absolute: 0.6 10*3/uL (ref 0.1–1.0)
Monocytes Relative: 7 %
Neutro Abs: 7.1 10*3/uL (ref 1.7–7.7)
Neutrophils Relative %: 77 %
Platelets: 245 10*3/uL (ref 150–400)
RBC: 4.07 MIL/uL — ABNORMAL LOW (ref 4.22–5.81)
RDW: 14.6 % (ref 11.5–15.5)
WBC: 9.2 10*3/uL (ref 4.0–10.5)
nRBC: 0 % (ref 0.0–0.2)

## 2021-05-23 LAB — BASIC METABOLIC PANEL
Anion gap: 6 (ref 5–15)
BUN: 13 mg/dL (ref 8–23)
CO2: 29 mmol/L (ref 22–32)
Calcium: 9.3 mg/dL (ref 8.9–10.3)
Chloride: 101 mmol/L (ref 98–111)
Creatinine, Ser: 0.82 mg/dL (ref 0.61–1.24)
GFR, Estimated: >60 mL/min (ref >60)
Glucose, Bld: 140 mg/dL — ABNORMAL HIGH (ref 70–99)
Potassium: 5.3 mmol/L — ABNORMAL HIGH (ref 3.5–5.1)
Sodium: 136 mmol/L (ref 135–145)

## 2021-05-23 LAB — BRAIN NATRIURETIC PEPTIDE: B Natriuretic Peptide: 65.7 pg/mL (ref 0.0–100.0)

## 2021-05-23 LAB — RESP PANEL BY RT-PCR (FLU A&B, COVID) ARPGX2
Influenza A by PCR: NEGATIVE
Influenza B by PCR: NEGATIVE
SARS Coronavirus 2 by RT PCR: NEGATIVE

## 2021-05-23 MED ORDER — ALBUTEROL SULFATE (5 MG/ML) 0.5% IN NEBU: 2.5000 mg | INHALATION_SOLUTION | Freq: Four times a day (QID) | RESPIRATORY_TRACT | 0 refills | Status: DC | PRN

## 2021-05-23 MED ORDER — ALBUTEROL (5 MG/ML) CONTINUOUS INHALATION SOLN
10.0000 mg/h | INHALATION_SOLUTION | RESPIRATORY_TRACT | Status: DC
  Administered 2021-05-23: 10 mg/h via RESPIRATORY_TRACT
  Filled 2021-05-23: qty 20

## 2021-05-23 MED ORDER — METHYLPREDNISOLONE SODIUM SUCC 125 MG IJ SOLR
125.0000 mg | Freq: Once | INTRAMUSCULAR | Status: AC
  Administered 2021-05-23: 125 mg via INTRAVENOUS
  Filled 2021-05-23: qty 2

## 2021-05-23 MED ORDER — PREDNISONE 10 MG PO TABS: 40.0000 mg | ORAL_TABLET | Freq: Every day | ORAL | 0 refills | Status: DC

## 2021-05-23 MED ORDER — ADVAIR HFA 230-21 MCG/ACT IN AERO: 2.0000 | INHALATION_SPRAY | Freq: Two times a day (BID) | RESPIRATORY_TRACT | 0 refills | Status: DC

## 2021-05-23 MED ORDER — IPRATROPIUM BROMIDE 0.02 % IN SOLN
0.5000 mg | Freq: Once | RESPIRATORY_TRACT | Status: AC
  Administered 2021-05-23: 0.5 mg via RESPIRATORY_TRACT
  Filled 2021-05-23: qty 2.5

## 2021-05-23 NOTE — ED Notes (Signed)
Patient discharge instructions and prescriptions reviewed with the patient. The patient verbalized understanding of instructions. Patient discharged. ?

## 2021-05-23 NOTE — ED Provider Notes (Signed)
[facility] EMERGENCY DEPARTMENT Provider Note   CSN: [id number] Arrival date & time: 05/23/21  5093     History Chief Complaint  Patient presents with   Shortness of Breath   Weakness    Edward Hurst is a 80 y.o. male.   Shortness of Breath Weakness Associated symptoms: shortness of breath   Edward Hurst is a 80 y.o. male who presents to the Emergency Department complaining of sob.  He presents to the ED accompanied by his wife for evaluation of sob.  Sxs have been ongoing for several weeks but significantly worsened over the last week.  For the last twenty four hours he has had associated right sided chest pain described as throbbing, temp to 99.  Couldn't sleep last night due to dyspnea.  Saw PCP one month ago and treated with advair and steroids.  Has appointment with pulm next week.    Has cough.  No abdominal pain, NV, leg swelling/pain.    Has been vaccinated and boostedx2 for COVID 19.       Past Medical History:  Diagnosis Date   Asthma    Chronic headache    Hyperlipidemia    Hypertension    Sleep apnea     There are no problems to display for this patient.   No past surgical history on file.     No family history on file.  Social History   Tobacco Use   Smoking status: Former    Packs/day: 3.00    Years: 10.00    Pack years: 30.00    Types: Cigarettes    Quit date: 1981    Years since quitting: 41.5   Smokeless tobacco: Never  Vaping Use   Vaping Use: Never used  Substance Use Topics   Alcohol use: Not Currently   Drug use: Never    Home Medications Prior to Admission medications   Medication Sig Start Date End Date Taking? Authorizing Provider  albuterol (PROVENTIL) (5 MG/ML) 0.5% nebulizer solution Take 0.5 mLs (2.5 mg total) by nebulization every 6 (six) hours as needed for wheezing or shortness of breath. 05/23/21  Yes Tilden Fossa, MD  predniSONE (DELTASONE) 10 MG tablet Take 4 tablets (40 mg total) by mouth daily.  05/23/21  Yes Tilden Fossa, MD  albuterol (VENTOLIN HFA) 108 (90 Base) MCG/ACT inhaler Inhale 1-2 puffs into the lungs every 6 (six) hours as needed for wheezing or shortness of breath. 03/15/21   Maxwell Caul, PA-C  benzonatate (TESSALON) 100 MG capsule Take 1 capsule (100 mg total) by mouth 2 (two) times daily as needed for cough. Patient not taking: Reported on 04/12/2021 05/08/20   Caccavale, Sophia, PA-C  Calcium Carb-Cholecalciferol (CALCIUM-VITAMIN D3) 600-500 MG-UNIT CAPS  05/19/20   [provider]  finasteride (PROSCAR) 5 MG tablet Take 5 mg by mouth daily. 02/15/21   [provider]  fluticasone-salmeterol (ADVAIR HFA) 230-21 MCG/ACT inhaler Inhale 2 puffs into the lungs 2 (two) times daily. 04/12/21   Luciano Cutter, MD  ipratropium (ATROVENT HFA) 17 MCG/ACT inhaler  05/19/20   [provider]  ipratropium-albuterol (DUONEB) 0.5-2.5 (3) MG/3ML SOLN     [provider]  lisinopril (ZESTRIL) 20 MG tablet Take 1 tablet by mouth daily. 01/27/21   [provider]  montelukast (SINGULAIR) 10 MG tablet Take 1 tablet by mouth daily. 03/30/21   [provider]  sildenafil (VIAGRA) 100 MG tablet Take 100 mg by mouth daily as needed. 11/22/20   [provider]  tamsulosin (FLOMAX) 0.4 MG CAPS capsule Take 0.4 mg by mouth daily. 04/06/21   [provider]  verapamil (VERELAN PM) 240 MG 24 hr capsule Take 240 mg by mouth daily. 02/15/21   [provider]  Vitamin D, Ergocalciferol, (DRISDOL) 1.25 MG (50000 UNIT) CAPS capsule Take 1 capsule by mouth once a week. 03/22/21   [provider]    Allergies    Patient has no known allergies.  Review of Systems   Review of Systems  Respiratory:  Positive for shortness of breath.   Neurological:  Positive for weakness.  All other systems reviewed and are negative.  Physical Exam Updated Vital Signs BP 113/71   Pulse (!) 109   Temp 98.1 F (36.7 C) (Oral)   Resp  19   SpO2 100%   Physical Exam Vitals and nursing note reviewed.  Constitutional:      Appearance: He is well-developed.  HENT:     Head: Normocephalic and atraumatic.  Cardiovascular:     Rate and Rhythm: Normal rate and regular rhythm.     Heart sounds: No murmur heard. Pulmonary:     Effort: Respiratory distress present.     Comments: Decreased air movement bilaterally.  Diffuse wheezes Abdominal:     Palpations: Abdomen is soft.     Tenderness: There is no abdominal tenderness. There is no guarding or rebound.  Musculoskeletal:        General: No swelling or tenderness.  Skin:    General: Skin is warm and dry.  Neurological:     Mental Status: He is alert and oriented to person, place, and time.  Psychiatric:        Behavior: Behavior normal.    ED Results / Procedures / Treatments   Labs (all labs ordered are listed, but only abnormal results are displayed) Labs Reviewed  BASIC METABOLIC PANEL - Abnormal; Notable for the following components:      Result Value   Potassium 5.3 (*)    Glucose, Bld 140 (*)    All other components within normal limits  CBC WITH DIFFERENTIAL/PLATELET - Abnormal; Notable for the following components:   RBC 4.07 (*)    MCV 101.5 (*)    All other components within normal limits  RESP PANEL BY RT-PCR (FLU A&B, COVID) ARPGX2  BRAIN NATRIURETIC PEPTIDE  TROPONIN I (HIGH SENSITIVITY)  TROPONIN I (HIGH SENSITIVITY)    EKG EKG Interpretation  Date/Time:  Monday May 23 2021 06:42:55 EDT Ventricular Rate:  93 PR Interval:  166 QRS Duration: 70 QT Interval:  332 QTC Calculation: 412 R Axis:   75 Text Interpretation: Sinus rhythm with occasional Premature ventricular complexes Otherwise normal ECG Confirmed by Gilda Crease [id number]) on 05/23/2021 6:54:51 AM  Radiology DG Chest Port 1 View  Result Date: 05/23/2021 CLINICAL DATA:  80 year old male with shortness of breath and weakness for 1 week. EXAM: PORTABLE CHEST 1 VIEW  COMPARISON:  Chest radiographs 03/15/2021 and earlier. FINDINGS: Portable AP upright view at 0741 hours. Chronically tortuous thoracic aorta and mediastinal contours appear stable since last year. Visualized tracheal air column is within normal limits. Lung volumes are at the upper limits of normal. Allowing for portable technique the lungs are clear. No pneumothorax or pleural effusion. No acute osseous abnormality identified. IMPRESSION: No acute cardiopulmonary abnormality. Stable contour of tortuous thoracic aorta. Electronically Signed   By: Odessa Fleming M.D.   On: 05/23/2021 07:52    Procedures Procedures   Medications Ordered in ED Medications  albuterol (PROVENTIL,VENTOLIN) solution continuous neb (0 mg/hr Nebulization Stopped 05/23/21 0930)  methylPREDNISolone sodium succinate (SOLU-MEDROL) 125 mg/2 mL injection 125 mg (125 mg Intravenous Given 05/23/21 0738)  ipratropium (ATROVENT) nebulizer solution 0.5 mg (0.5 mg Nebulization Given 05/23/21 0759)    ED Course  I have reviewed the triage vital signs and the nursing notes.  Pertinent labs & imaging results that were available during my care of the patient were reviewed by me and considered in my medical decision making (see chart for details).    MDM Rules/Calculators/A&P                         patient with history of COPD here for evaluation of shortness of breath for the last several weeks, worsening over the last week. Patient with increased work of breathing, wheezing on initial ED assessment. He was treated with albuterol with significant improvement in his symptoms. On reassessment his work of breathing is normal. He does have persistent wheezing. Patient is requesting discharge. Will treat for COPD exacerbation with steroid burst, close outpatient follow-up and return precautions.  presentation is not consistent with PE, CHF, ACS, dissection, pneumonia. Final Clinical Impression(s) / ED Diagnoses Final diagnoses:  COPD exacerbation  (HCC)    Rx / DC Orders ED Discharge Orders          Ordered    predniSONE (DELTASONE) 10 MG tablet  Daily        05/23/21 0955    albuterol (PROVENTIL) (5 MG/ML) 0.5% nebulizer solution  Every 6 hours PRN        05/23/21 0955             Tilden Fossa, MD 05/23/21 1130

## 2021-05-23 NOTE — ED Triage Notes (Signed)
Pt complaining of sob and weakness for one week. Pt reports being out of advair inhaler for a week. Pt sounds congested. Pt reports feeling like it is difficult to breathe. Pt reports having a pulmonary doctor and was due to have a pulmonary test to see if he has copd. Pt has a hx of asthma.

## 2021-05-27 ENCOUNTER — Other Ambulatory Visit (HOSPITAL_COMMUNITY): Payer: Medicare (Managed Care)

## 2021-05-31 ENCOUNTER — Ambulatory Visit (INDEPENDENT_AMBULATORY_CARE_PROVIDER_SITE_OTHER): Payer: Medicare Other | Admitting: Pulmonary Disease

## 2021-05-31 ENCOUNTER — Encounter: Payer: Self-pay | Admitting: Pulmonary Disease

## 2021-05-31 ENCOUNTER — Other Ambulatory Visit: Payer: Self-pay

## 2021-05-31 VITALS — BP 124/70 | HR 82 | Temp 98.2°F | Ht 69.0 in | Wt 169.8 lb

## 2021-05-31 DIAGNOSIS — J449 Chronic obstructive pulmonary disease, unspecified: Secondary | ICD-10-CM

## 2021-05-31 NOTE — Progress Notes (Signed)
Patient was not able to complete PFT. He was not able to process directions.

## 2021-05-31 NOTE — Patient Instructions (Addendum)
Asthma-COPD overlap syndrome --CONTINUE Advair 230-21 mcg TWO puffs TWICE a day. This is your EVERYDAY inhaler --CONTINUE Albuterol TWO puffs as needed. This is your RESCUE inhaler --CONTINUE Duonebs (nebulizer) up to four times a day. This is your RESCUE nebulizer --START Singulair 10 mg daily  Follow-up with me in 3 months

## 2021-05-31 NOTE — Progress Notes (Signed)
Subjective:   PATIENT ID: Edward Hurst GENDER: male DOB: Jul 13, 1941, MRN: [id number]   HPI  Chief Complaint  Patient presents with   Follow-up    Pt attempted PFT today but was unable to complete it due to not able to follow directions correctly.  Pt states he has been doing okay since last visit. Has had some SOB.    Reason for Visit: Follow-up  Mr. Cauy Melody is a 80 year old male former smoker with asthma who presents for follow-up  Synopsis: He reports history of asthma that would intermittently affect him since his 57s which resulted in quitting smoking. He would have shortness of breath, coughing and wheezing every year. Worse in the springtime, pollen and allergens. He has presented to the ED at least twice in the last few years however the last 12 months he has had presented to the ED five times requiring treatment with steroids. He uses his atrovent every 5-6 hours and albuterol every 4 hours. He has chronic cough with occasional yellow-green sputum and shortness of breath with exertion. Wheezing has resolved with inhaler use.  Since our last visit, he was in the ED for shortness of breath and received steroids and nebulizer. Does not recall any triggers however ran out of his Advair for five days before developing worsening shortness of breath. When he is compliant with his inhaler his symptoms are well-controlled with minimal cough and no shortness of breath or wheezing. He was unable to complete his PFTs today.  Social History: Quit 30 years ago. He was smoking 3 packs per day x 10 years. 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 20 mL 0   albuterol (VENTOLIN HFA) 108 (90 Base) MCG/ACT inhaler Inhale 1-2 puffs into the lungs every 6 (six) hours as needed for wheezing or shortness of breath. 8 g 0   Calcium Carb-Cholecalciferol (CALCIUM-VITAMIN D3) 600-500 MG-UNIT CAPS      finasteride (PROSCAR) 5 MG tablet Take 5 mg by mouth daily.     fluticasone-salmeterol (ADVAIR HFA) 230-21 MCG/ACT inhaler Inhale 2 puffs into the lungs 2 (two) times daily. 1 each 0   ipratropium (ATROVENT HFA) 17 MCG/ACT inhaler      ipratropium-albuterol (DUONEB) 0.5-2.5 (3) MG/3ML SOLN      lisinopril (ZESTRIL) 20 MG tablet Take 1 tablet by mouth daily.     montelukast (SINGULAIR) 10 MG tablet Take 1 tablet by mouth daily.     sildenafil (VIAGRA) 100 MG tablet Take 100 mg by mouth daily as needed.     tamsulosin (FLOMAX) 0.4 MG CAPS capsule Take 0.4 mg by mouth daily.     verapamil (VERELAN PM) 240 MG 24 hr capsule Take 240 mg by mouth daily.     Vitamin D, Ergocalciferol, (DRISDOL) 1.25 MG (50000 UNIT) CAPS capsule Take 1 capsule by mouth once a week.     benzonatate (TESSALON) 100 MG capsule Take 1 capsule (100 mg total) by mouth 2 (two) times daily as needed for cough. (  Patient not taking: Reported on 04/12/2021) 21 capsule 0   predniSONE (DELTASONE) 10 MG tablet Take 4 tablets (40 mg total) by mouth daily. 20 tablet 0   No facility-administered medications prior to visit.    Review of Systems  Constitutional:  Negative for chills, diaphoresis, fever, malaise/fatigue and weight loss.  HENT:  Negative for congestion.   Respiratory:  Positive for cough, shortness of breath and wheezing. Negative for hemoptysis and sputum production.   Cardiovascular:  Negative for chest pain, palpitations and leg swelling.    Objective:   Vitals:   05/31/21 1448  BP: 124/70  Pulse: 82  Temp: 98.2 F (36.8 C)  TempSrc: Oral  SpO2:  100%  Weight: 169 lb 12.8 oz (77 kg)  Height: 5\' 9"  (1.753 m)   SpO2: 100 % (RA) O2 Device: None (Room air)  Physical Exam: General: Well-appearing, no acute distress HENT: [place], AT Eyes: EOMI, no scleral icterus Respiratory: Clear to auscultation bilaterally.  No crackles, wheezing or rales Cardiovascular: RRR, -M/R/G, no JVD Extremities:-Edema,-tenderness Neuro: AAO x4, CNII-XII grossly intact Skin: Intact, no rashes or bruising Psych: Normal mood, normal affect  Data Reviewed:  Imaging: CXR 03/11/21 - No acute abnormalities  PFT: None on file  Labs: CBC    Component Value Date/Time   WBC 9.2 05/23/2021 0733   RBC 4.07 (L) 05/23/2021 0733   HGB 13.0 05/23/2021 0733   HCT 41.3 05/23/2021 0733   PLT 245 05/23/2021 0733   MCV 101.5 (H) 05/23/2021 0733   MCH 31.9 05/23/2021 0733   MCHC 31.5 05/23/2021 0733   RDW 14.6 05/23/2021 0733   LYMPHSABS 1.0 05/23/2021 0733   MONOABS 0.6 05/23/2021 0733   EOSABS 0.3 05/23/2021 0733   BASOSABS 0.1 05/23/2021 0733   Absolute eos 04/12/21 - 700  Assessment & Plan:   Discussion: 80 year old male former smoker with asthma-COPD overlap. Recent COPD exacerbation on 05/2021 when he ran out of inhalers. Counseled on bronchodilator compliance and use.  Suspected Asthma-COPD - controlled when compliant, not in exacerbation today --CONTINUE Advair 230-21 mcg TWO puffs TWICE a day. This is your EVERYDAY inhaler. Refill --CONTINUE Albuterol TWO puffs as needed. This is your RESCUE inhaler. Refill --CONTINUE Duonebs (nebulizer) up to four times a day. This is your RESCUE nebulizer. Refill --START singulair 10 mg daily   Health Maintenance Immunization History  Administered Date(s) Administered   Influenza, Quadrivalent, Recombinant, Inj, Pf 08/19/2020   Moderna Sars-Covid-2 Vaccination 08/19/2020, 10/28/2020   CT Lung Screen - not indicated  No orders of the defined types were placed in this encounter.  No orders of the defined  types were placed in this encounter.   No follow-ups on file.  I have spent a total time of 40 -minutes on the day of the appointment reviewing prior documentation, coordinating care and discussing medical diagnosis and plan with the patient/family. Past medical history, allergies, medications were reviewed. Pertinent imaging, labs and tests included in this note have been reviewed and interpreted independently by me.  Sabrinia Prien 14/07/2020, MD [facility] 05/31/2021 3:24 PM  Office Number [phone]

## 2021-06-17 DIAGNOSIS — J449 Chronic obstructive pulmonary disease, unspecified: Secondary | ICD-10-CM | POA: Insufficient documentation

## 2021-08-28 IMAGING — CR DG CHEST 2V
2 series · 2 of 2 positions shown · non-contrast
Comparison: No prior exams available.

CLINICAL DATA: Shortness of breath.  Pain on inspiration.

EXAM:
CHEST - 2 VIEW

[chest pa]
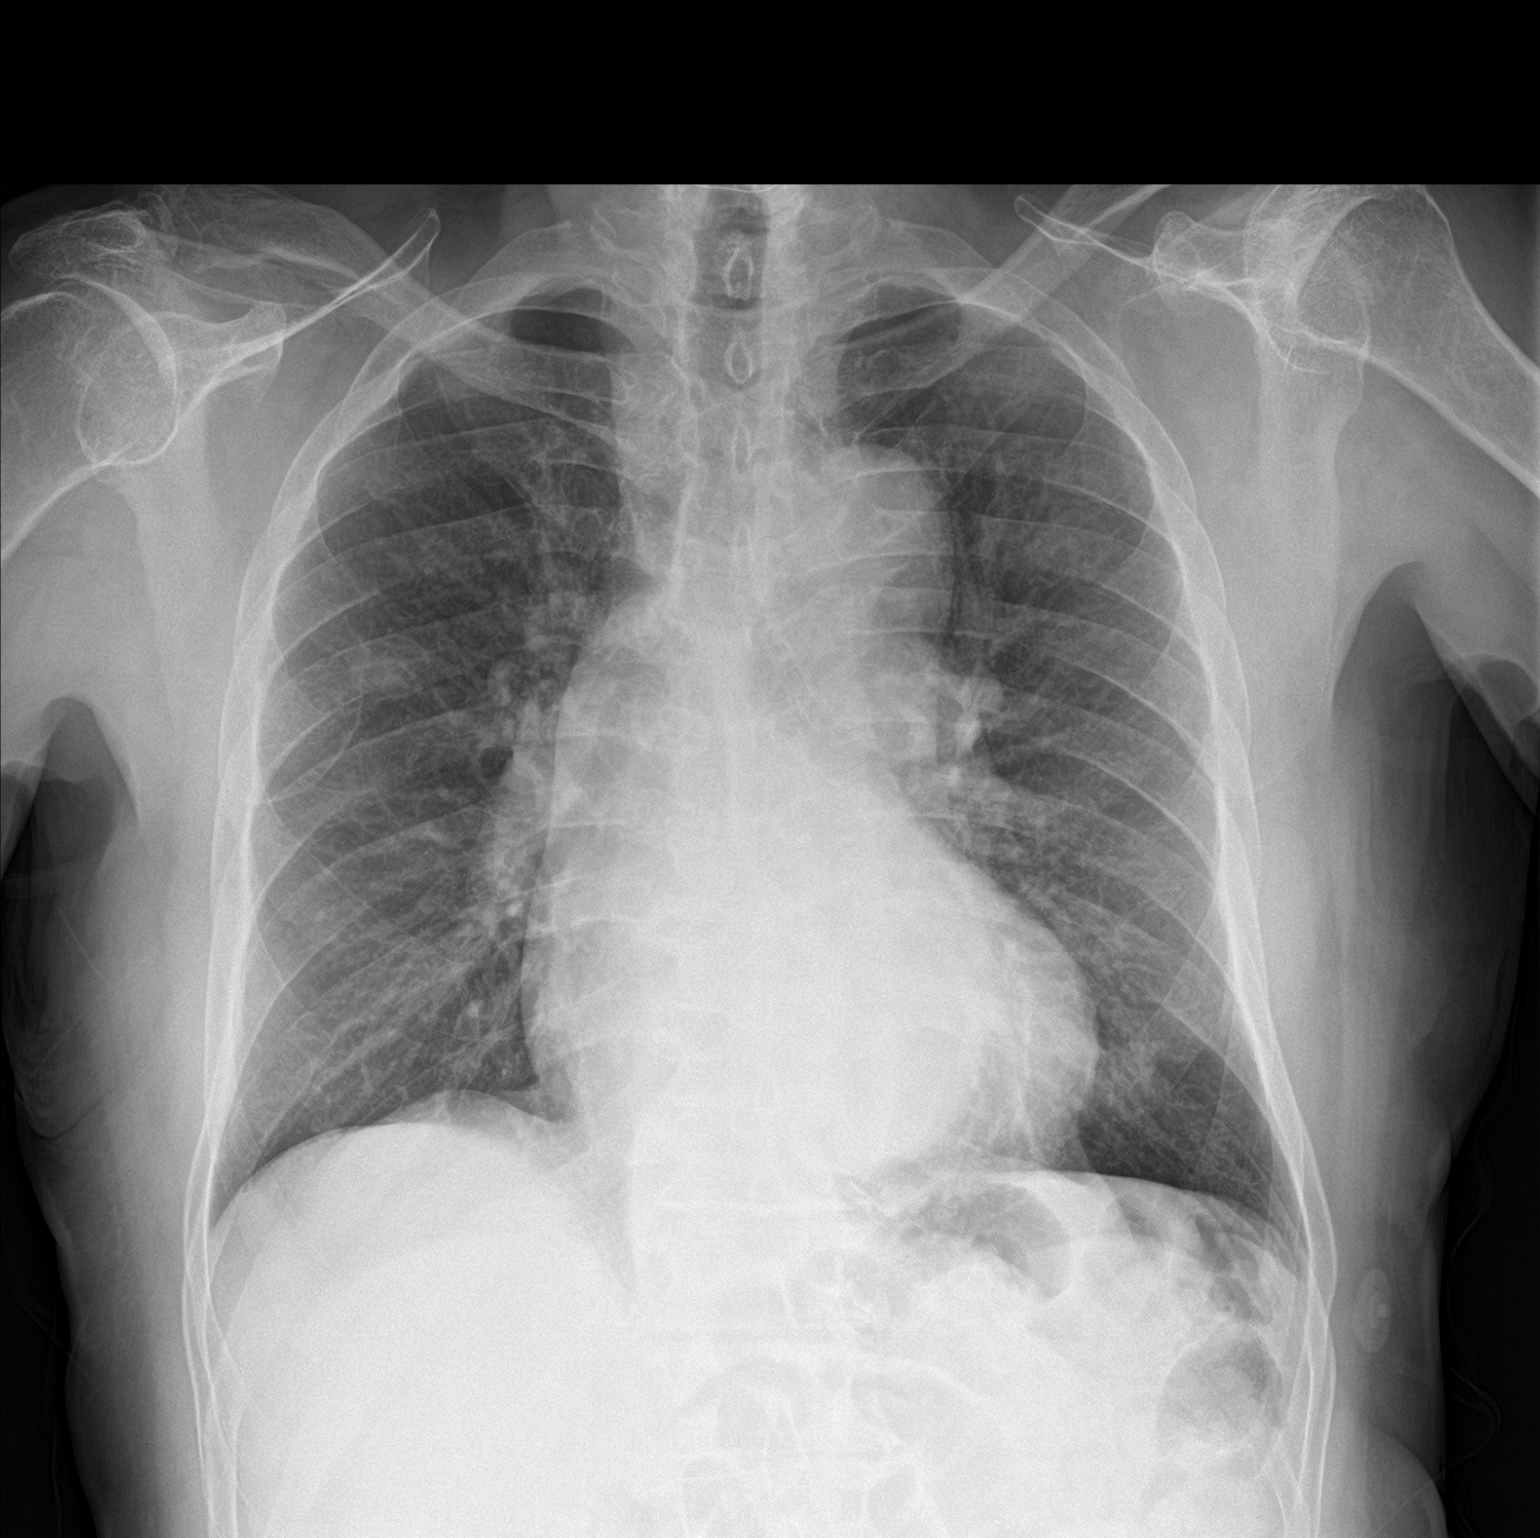

[chest lat]
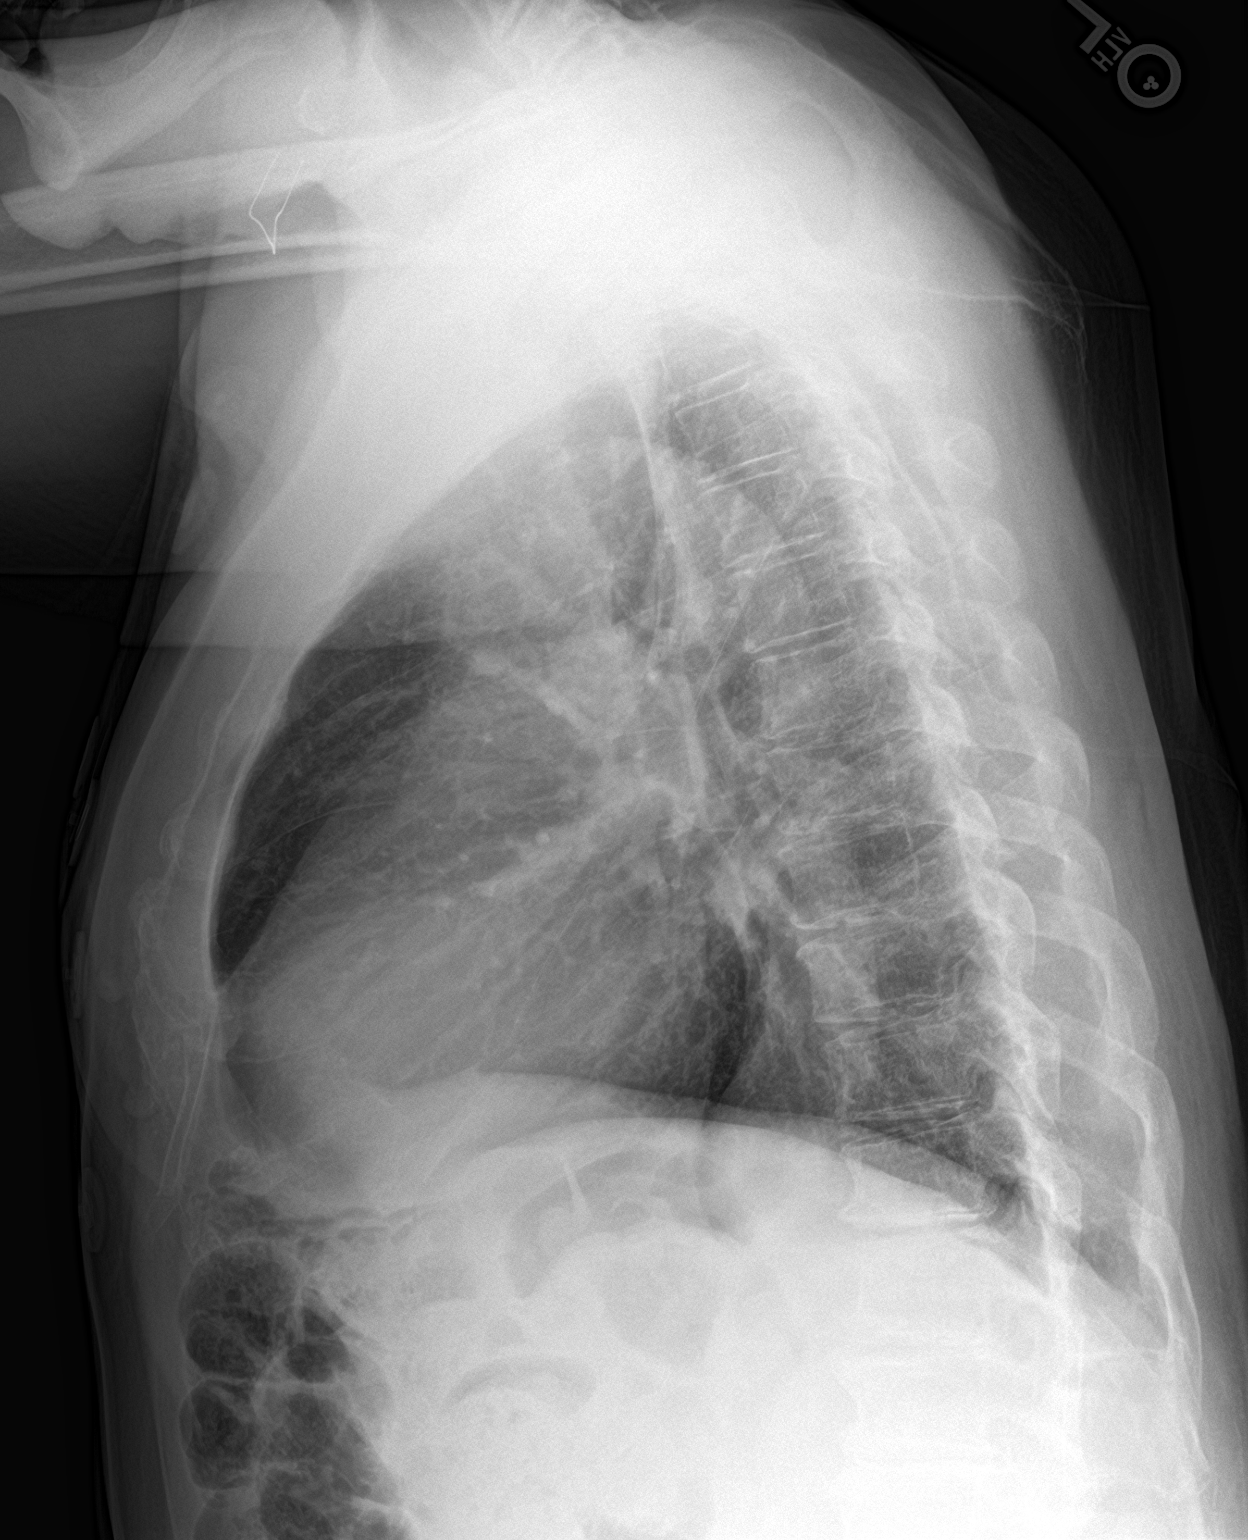

[2 of 2 positions shown; findings below may reference images not displayed]

FINDINGS: Upper normal heart size. Aortic tortuosity. Mild peribronchial
thickening of unknown acuity. Questionable not definite 2.1 cm
nodule in the right mid lung zone versus vascular overlap. No focal
airspace disease, pneumothorax, or pleural effusion. No pulmonary
edema. Age related degenerative change in the spine and shoulders.
No acute osseous abnormalities are seen.
IMPRESSION: 1. Mild peribronchial thickening of unknown acuity.
2. Questionable 2.1 cm nodule in the right mid lung zone versus
vascular overlap. There are no prior exams available for comparison.
Recommend further evaluation with chest CT. This could be performed
on an elective basis.
3. Upper normal heart size with aortic tortuosity.

## 2021-09-03 IMAGING — DX DG CHEST 1V PORT
1 series · 1 of 1 positions shown · non-contrast
Comparison: May 02, 2020

CLINICAL DATA: Shortness of breath

EXAM:
PORTABLE CHEST 1 VIEW

[chest]
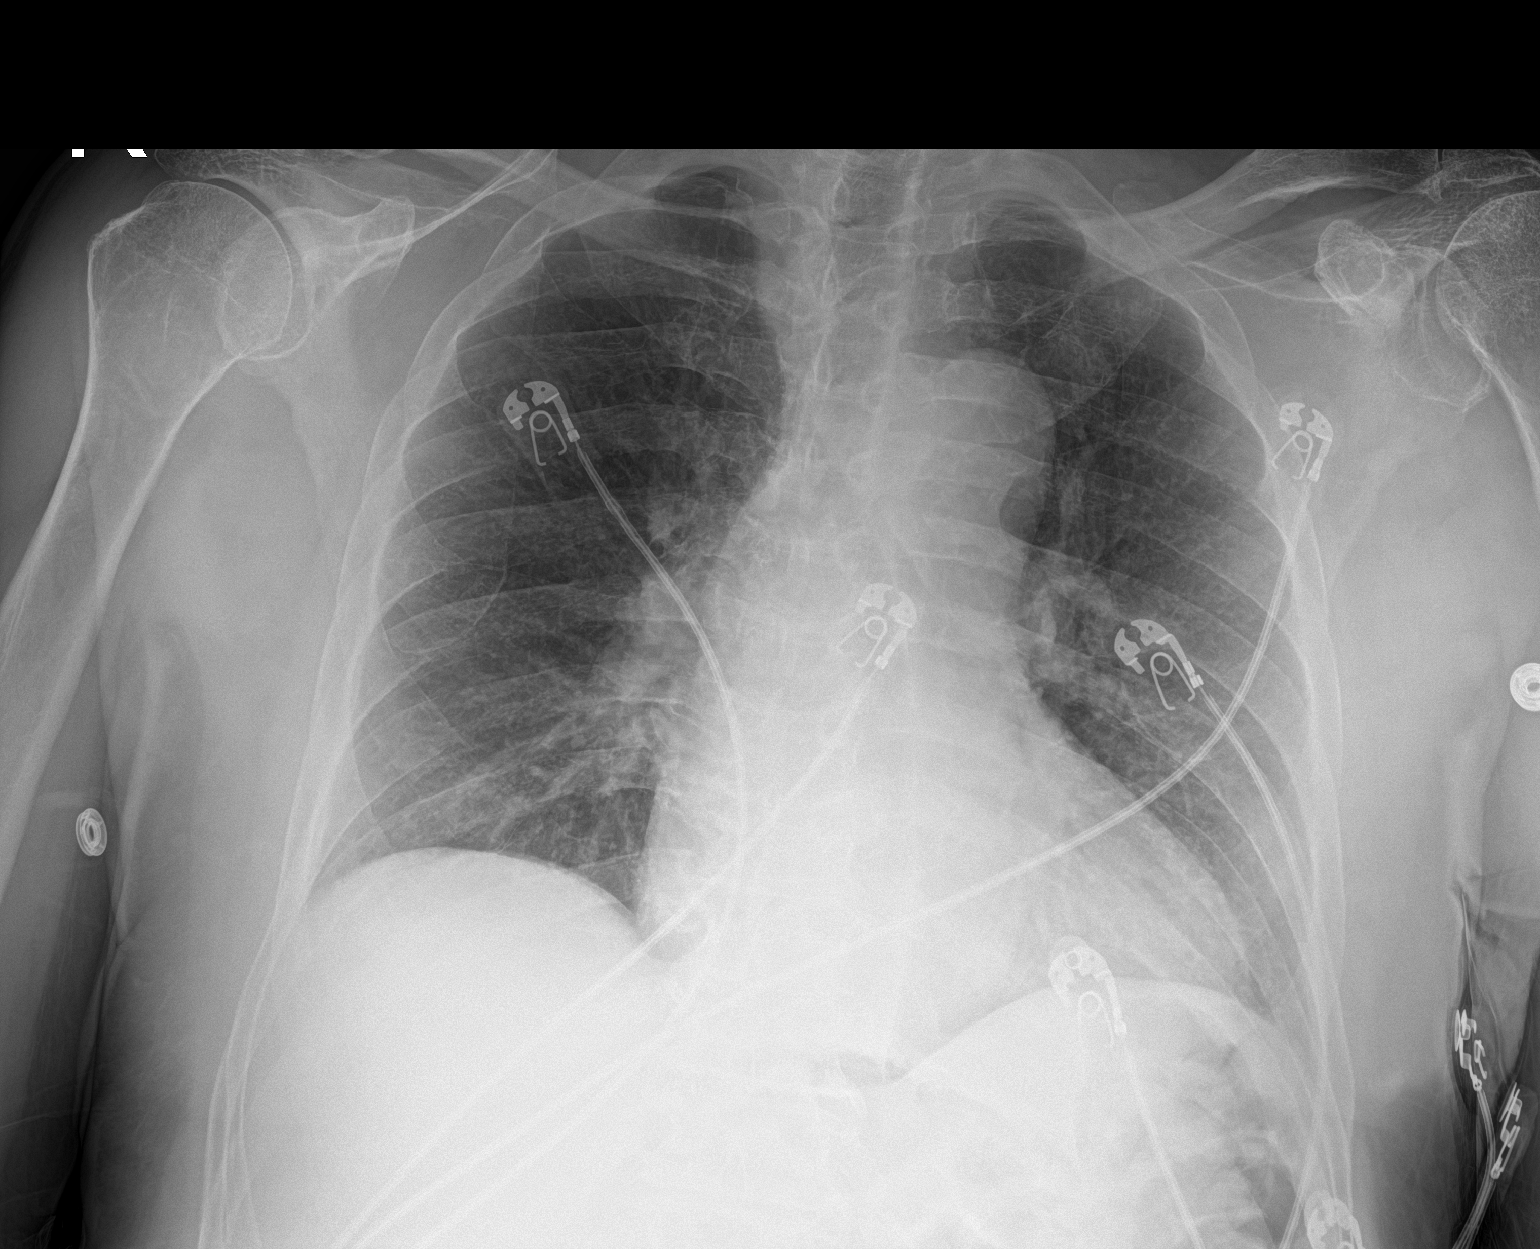

[1 of 1 positions shown; findings below may reference images not displayed]

FINDINGS: The heart size and mediastinal contours are within normal limits.
Both lungs are clear. The visualized skeletal structures are
unremarkable.
IMPRESSION: No active disease.

## 2021-09-15 ENCOUNTER — Encounter: Payer: Self-pay | Admitting: Pulmonary Disease

## 2021-09-15 ENCOUNTER — Ambulatory Visit (INDEPENDENT_AMBULATORY_CARE_PROVIDER_SITE_OTHER): Payer: Self-pay | Admitting: Pulmonary Disease

## 2021-09-15 ENCOUNTER — Other Ambulatory Visit: Payer: Self-pay

## 2021-09-15 VITALS — BP 132/82 | HR 88 | Temp 97.7°F | Ht 69.0 in | Wt 169.0 lb

## 2021-09-15 DIAGNOSIS — J449 Chronic obstructive pulmonary disease, unspecified: Secondary | ICD-10-CM

## 2021-09-15 MED ORDER — ADVAIR HFA 230-21 MCG/ACT IN AERO: 2.0000 | INHALATION_SPRAY | Freq: Two times a day (BID) | RESPIRATORY_TRACT | 11 refills | Status: AC

## 2021-09-15 MED ORDER — IPRATROPIUM-ALBUTEROL 0.5-2.5 (3) MG/3ML IN SOLN: 3.0000 mL | Freq: Four times a day (QID) | RESPIRATORY_TRACT | 5 refills | Status: DC | PRN

## 2021-09-15 NOTE — Patient Instructions (Addendum)
Asthma-COPD - symptomatic GOLD Class D --CONTINUE Advair 230-21 mcg TWO puffs TWICE a day. This is your EVERYDAY inhaler --CONTINUE Albuterol TWO puffs as needed. This is your RESCUE inhaler --CONTINUE Duonebs (nebulizer) up to four times a day. This is your RESCUE nebulizer. Refill --REFER to pulmonary rehab  Follow-up with me 4 months

## 2021-09-15 NOTE — Progress Notes (Signed)
Subjective:   PATIENT ID: Edward Hurst GENDER: male DOB: Nov 29, 1940, MRN: [id number]   HPI  Chief Complaint  Patient presents with   Follow-up    COPD    Reason for Visit: Follow-up  [name] is a 80 year old male former smoker with asthma who presents for follow-up  Synopsis: He reports history of asthma that would intermittently affect him since his 58s which resulted in quitting smoking. He would have shortness of breath, coughing and wheezing every year. Worse in the springtime, pollen and allergens. He has presented to the ED at least twice in the last few years however the last 12 months he has had presented to the ED five times requiring treatment with steroids. He uses his atrovent every 5-6 hours and albuterol every 4 hours. He has chronic cough with occasional yellow-green sputum and shortness of breath with exertion. Wheezing has resolved with inhaler use.  05/31/21 Since our last visit, he was in the ED for shortness of breath and received steroids and nebulizer. Does not recall any triggers however ran out of his Advair for five days before developing worsening shortness of breath. When he is compliant with his inhaler his symptoms are well-controlled with minimal cough and no shortness of breath or wheezing. He was unable to complete his PFTs today.  09/15/21 Since our last visit, he has been compliant with Advair twice a day. Only uses albuterol once a day. Able to walk short distance around the house. No wheezing or coughing. Not able to do long distances. Unable to walk around the grocery store.   Social History: Quit 30 years ago. He was smoking 3 packs per day x 10 years. 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 20 mL 0   albuterol (VENTOLIN HFA) 108 (90 Base) MCG/ACT inhaler Inhale 1-2 puffs into the lungs every 6 (six) hours as needed for wheezing or shortness of breath. 8 g 0   Calcium Carb-Cholecalciferol (CALCIUM-VITAMIN D3) 600-500 MG-UNIT CAPS      finasteride (PROSCAR) 5 MG tablet Take 5 mg by mouth daily.     fluticasone-salmeterol (ADVAIR HFA) 230-21 MCG/ACT inhaler Inhale 2 puffs into the lungs 2 (two) times daily. 1 each 0   ipratropium (ATROVENT HFA) 17 MCG/ACT inhaler      ipratropium-albuterol (DUONEB) 0.5-2.5 (3) MG/3ML SOLN      lisinopril (ZESTRIL) 20 MG tablet Take 1 tablet by mouth daily.     montelukast (SINGULAIR) 10 MG tablet Take 1 tablet by mouth daily.     sildenafil (VIAGRA) 100 MG tablet Take 100 mg by mouth daily as needed.     tamsulosin (FLOMAX) 0.4 MG CAPS capsule Take 0.4 mg by mouth daily.     verapamil (VERELAN PM) 240 MG 24 hr capsule Take 240 mg by mouth daily.     Vitamin D, Ergocalciferol, (DRISDOL) 1.25 MG (50000 UNIT) CAPS capsule Take 1 capsule by mouth once a week.     No facility-administered medications prior to  visit.    Review of Systems  Constitutional:  Negative for chills, diaphoresis, fever, malaise/fatigue and weight loss.  HENT:  Negative for congestion.   Respiratory:  Positive for shortness of breath. Negative for cough, hemoptysis, sputum production and wheezing.   Cardiovascular:  Negative for chest pain, palpitations and leg swelling.    Objective:   Vitals:   09/15/21 1408  BP: 132/82  Pulse: 88  Temp: 97.7 F (36.5 C)  TempSrc: Oral  SpO2: 99%  Weight: 169 lb (76.7 kg)  Height: 5\' 9"  (1.753 m)   SpO2: 99 % O2 Device: None (Room air)  Physical Exam: General: Well-appearing, no acute distress HENT: [place], AT Eyes:  EOMI, no scleral icterus Respiratory: Clear to auscultation bilaterally.  No crackles, wheezing or rales Cardiovascular: RRR, -M/R/G, no JVD Extremities:-Edema,-tenderness Neuro: AAO x4, CNII-XII grossly intact Psych: Normal mood, normal affect  Data Reviewed:  Imaging: CXR 03/11/21 - No acute abnormalities CXR 05/23/21 - No infiltrate, effusion or edema  PFT None on file  Labs: CBC    Component Value Date/Time   WBC 9.2 05/23/2021 0733   RBC 4.07 (L) 05/23/2021 0733   HGB 13.0 05/23/2021 0733   HCT 41.3 05/23/2021 0733   PLT 245 05/23/2021 0733   MCV 101.5 (H) 05/23/2021 0733   MCH 31.9 05/23/2021 0733   MCHC 31.5 05/23/2021 0733   RDW 14.6 05/23/2021 0733   LYMPHSABS 1.0 05/23/2021 0733   MONOABS 0.6 05/23/2021 0733   EOSABS 0.3 05/23/2021 0733   BASOSABS 0.1 05/23/2021 0733   Absolute eos 04/12/21 - 700  Assessment & Plan:   Discussion: 80 year old male former smoker with asthma-COPD overlap. Last COPD exacerbation 05/2021. Not in active exacerbation however remains symptomatic. Discussed clinical course of asthma, bronchodilator management, counseled on benefit of pulmonary rehab and action plan for exacerbations.  Asthma-COPD - symptomatic GOLD Class D --CONTINUE Advair 230-21 mcg TWO puffs TWICE a day. This is your EVERYDAY inhaler --CONTINUE Albuterol TWO puffs as needed. This is your RESCUE inhaler --CONTINUE Duonebs (nebulizer) up to four times a day. This is your RESCUE nebulizer. Refill --REFER to pulmonary rehab --ARRANGE pulmonary function tests  Health Maintenance Immunization History  Administered Date(s) Administered   Influenza, Quadrivalent, Recombinant, Inj, Pf 08/19/2020   Moderna Sars-Covid-2 Vaccination 08/19/2020, 10/28/2020   CT Lung Screen - not indicated  Orders Placed This Encounter  Procedures   Pulmonary function test    Standing Status:   Future    Standing Expiration Date:   09/15/2022    Order Specific Question:   Where should  this test be performed?    Answer:   [facility]    Meds ordered this encounter  Medications   fluticasone-salmeterol (ADVAIR HFA) 230-21 MCG/ACT inhaler    Sig: Inhale 2 puffs into the lungs 2 (two) times daily.    Dispense:  12 g    Refill:  11   ipratropium-albuterol (DUONEB) 0.5-2.5 (3) MG/3ML SOLN    Sig: Inhale 3 mLs into the lungs every 6 (six) hours as needed (shortness of breath or wheezing).    Dispense:  360 mL    Refill:  5     Return in about 4 months (around 01/16/2022).  I have spent a total time of 33-minutes on the day of the appointment reviewing prior documentation, coordinating care and discussing medical diagnosis and plan with the patient/family. Past medical history, allergies, medications were reviewed. Pertinent imaging, labs and tests included in this note have been reviewed and  interpreted independently by me.  Yuji Walth Mechele Collin, MD [facility] 09/15/2021 2:18 PM  Office Number [phone]

## 2021-09-19 ENCOUNTER — Encounter: Payer: Self-pay | Admitting: Pulmonary Disease

## 2021-12-23 ENCOUNTER — Other Ambulatory Visit: Payer: Self-pay | Admitting: Internal Medicine

## 2021-12-26 ENCOUNTER — Other Ambulatory Visit: Payer: Self-pay | Admitting: Internal Medicine

## 2021-12-26 DIAGNOSIS — S72009A Fracture of unspecified part of neck of unspecified femur, initial encounter for closed fracture: Secondary | ICD-10-CM

## 2022-05-24 ENCOUNTER — Other Ambulatory Visit: Payer: Self-pay

## 2022-06-07 ENCOUNTER — Other Ambulatory Visit: Payer: Self-pay | Admitting: Family

## 2022-06-07 DIAGNOSIS — M25512 Pain in left shoulder: Secondary | ICD-10-CM

## 2022-06-22 ENCOUNTER — Inpatient Hospital Stay: Admission: RE | Admit: 2022-06-22 | Payer: Self-pay | Source: Ambulatory Visit

## 2022-07-11 IMAGING — CR DG CHEST 2V
2 series · 2 of 2 positions shown · non-contrast
Comparison: 02/28/2021

CLINICAL DATA: Dyspnea

EXAM:
CHEST - 2 VIEW

[chest lat]
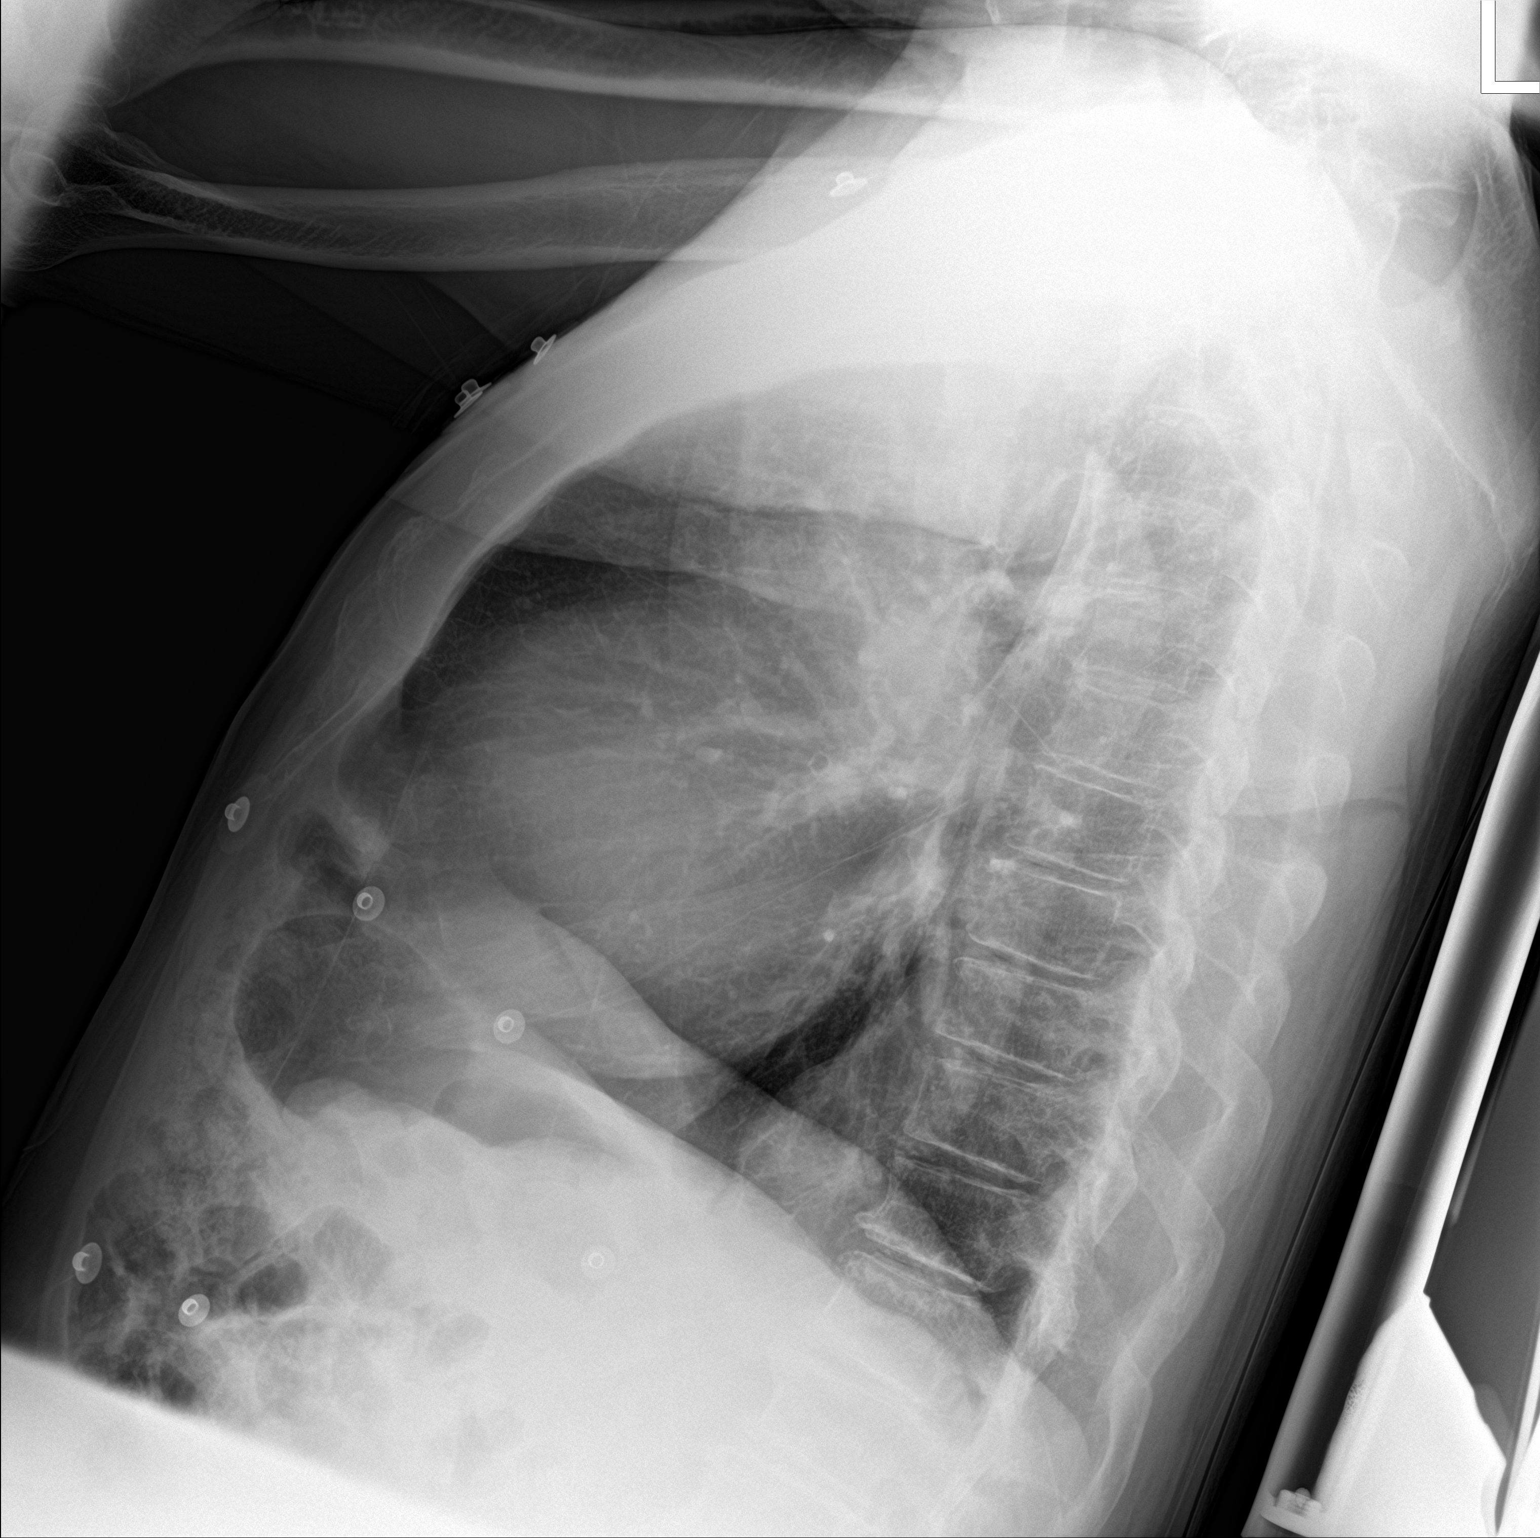

[chest ap]
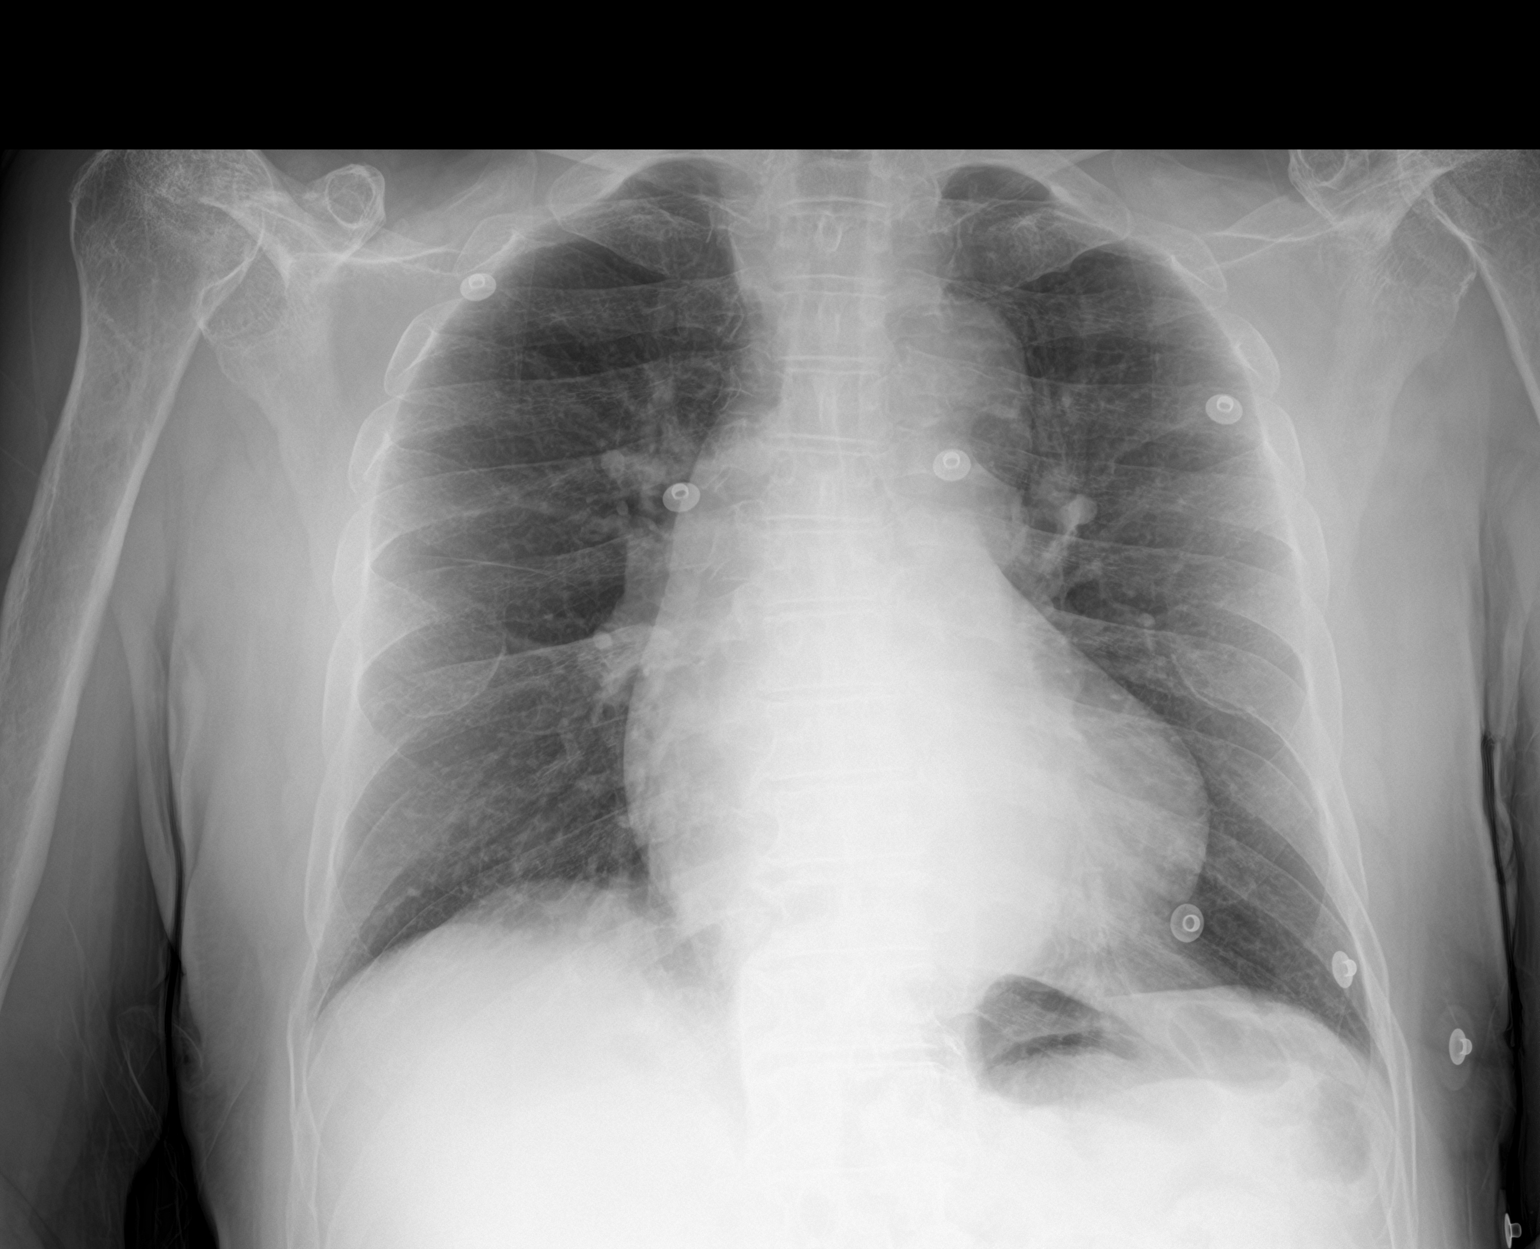

[2 of 2 positions shown; findings below may reference images not displayed]

FINDINGS: Lungs are clear. No pneumothorax or pleural effusion. Cardiac size
is mildly enlarged, unchanged. Pulmonary vascularity is normal. No
acute bone abnormality. Degenerative changes are noted within the
shoulders.
IMPRESSION: No active cardiopulmonary disease.  Stable cardiomegaly.

## 2022-08-29 ENCOUNTER — Other Ambulatory Visit: Payer: Self-pay | Admitting: Infectious Diseases

## 2022-08-29 ENCOUNTER — Ambulatory Visit
Admission: RE | Admit: 2022-08-29 | Discharge: 2022-08-29 | Disposition: A | Discharge status: Home / Self Care | Payer: Medicare PPO | Source: Ambulatory Visit | Attending: Infectious Diseases | Admitting: Infectious Diseases

## 2022-08-29 DIAGNOSIS — R52 Pain, unspecified: Secondary | ICD-10-CM

## 2022-12-14 DIAGNOSIS — K5900 Constipation, unspecified: Secondary | ICD-10-CM | POA: Diagnosis not present

## 2022-12-14 DIAGNOSIS — E1142 Type 2 diabetes mellitus with diabetic polyneuropathy: Secondary | ICD-10-CM | POA: Diagnosis not present

## 2022-12-14 DIAGNOSIS — I771 Stricture of artery: Secondary | ICD-10-CM | POA: Diagnosis not present

## 2022-12-14 DIAGNOSIS — F606 Avoidant personality disorder: Secondary | ICD-10-CM | POA: Diagnosis not present

## 2022-12-14 DIAGNOSIS — D849 Immunodeficiency, unspecified: Secondary | ICD-10-CM | POA: Diagnosis not present

## 2022-12-14 DIAGNOSIS — G3184 Mild cognitive impairment, so stated: Secondary | ICD-10-CM | POA: Diagnosis not present

## 2022-12-14 DIAGNOSIS — I700 Atherosclerosis of aorta: Secondary | ICD-10-CM | POA: Diagnosis not present

## 2022-12-14 DIAGNOSIS — I429 Cardiomyopathy, unspecified: Secondary | ICD-10-CM | POA: Diagnosis not present

## 2022-12-14 DIAGNOSIS — I10 Essential (primary) hypertension: Secondary | ICD-10-CM | POA: Diagnosis not present

## 2022-12-14 DIAGNOSIS — Z0289 Encounter for other administrative examinations: Secondary | ICD-10-CM | POA: Diagnosis not present

## 2022-12-14 DIAGNOSIS — J449 Chronic obstructive pulmonary disease, unspecified: Secondary | ICD-10-CM | POA: Diagnosis not present

## 2023-07-20 ENCOUNTER — Other Ambulatory Visit: Payer: Self-pay | Admitting: Family Medicine

## 2023-07-20 DIAGNOSIS — R410 Disorientation, unspecified: Secondary | ICD-10-CM

## 2023-07-27 ENCOUNTER — Ambulatory Visit
Admission: RE | Admit: 2023-07-27 | Discharge: 2023-07-27 | Disposition: A | Discharge status: Home / Self Care | Payer: Medicare Other | Source: Ambulatory Visit | Attending: Family Medicine | Admitting: Family Medicine

## 2023-07-27 DIAGNOSIS — R410 Disorientation, unspecified: Secondary | ICD-10-CM

## 2023-12-07 NOTE — Progress Notes (Deleted)
 [facility] CONSULT NOTE  Patient Care Team: Azalia Leo, MD as PCP - General (Internal Medicine)  ASSESSMENT & PLAN:  Edward Hurst is a 83 y.o.male with history of *** being [facility] for prostate cancer.  Diagnosis is not clear at this time. Given PSA not significantly elevated and nondiagnostic with lytic lesion, recommend further work up including rule out myeloma with labs, biopsy and additional imaging.  The patient was counseled on the natural history of prostate cancer and the standard treatment options that are available for prostate cancer.   No problem-specific Assessment & Plan notes found for this encounter.   No orders of the defined types were placed in this encounter.   Supportive baseline bone mineral density study and then every 2 years calcium (1000-1200 mg daily from food and supplements) and vitamin D3 (1000 IU daily) Zometa (5 mg IV annually) for osteopenia (T-score between -1.0 and -2.5) on ADT after dental clearance. If CRPC, 4 mg every 3 months if having bone metastases. Control and prevent diabetes Aggressive cardiovascular risk management Weight-bearing exercises (30 minutes per day) Limit alcohol consumption and avoid smoking  The total time spent in the appointment was {CHL ONC TIME VISIT - UEAVW:[id number]} encounter with patients including review of chart and various tests results, discussions about plan of care and coordination of care plan  All questions were answered. The patient knows to call the clinic with any problems, questions or concerns. No barriers to learning was detected.  Lowanda Ruddy, MD 1/20/202512:47 PM  CHIEF COMPLAINTS/PURPOSE OF CONSULTATION:  Prostate cancer  HISTORY OF PRESENTING ILLNESS:  Edward Hurst 83 y.o. male is here because of prostate cancer.  Patient presented with microscopic hematuria and lower urinary tract symptoms in October 2024.  Workup showed 2 subcentimeter sized lytic  lesions in the right iliac bone with thinning of the overlying cortex on CT scan.  CT also reported 1.3 x 2.1 cm irregular area over the left lung lower lobe favoring fibrosis/scarring likely present in 2021.  Cystoscopy reported negative for bladder mass, enlarged prostate.    09/14/23 creatinine 0.8  10/23/23 CT AP: 2 subcentimeter sized lytic lesions in the right iliac bone with thinning of the overlying cortex .1.3 x 2.1 cm irregular area over the left lung lower lobe favoring fibrosis/scarring likely present in 2021.    11/28/23 Cystoscopy reported negative for bladder mass, enlarged prostate.   PSA 3.4   I have reviewed his chart and materials related to his cancer extensively and collaborated history with the patient. Summary of oncologic history is as follows: Oncology History   No history exists.    MEDICAL HISTORY:  Past Medical History:  Diagnosis Date   Asthma    Chronic headache    Hyperlipidemia    Hypertension    Sleep apnea     SURGICAL HISTORY: No past surgical history on file.  SOCIAL HISTORY: Social History   Socioeconomic History   Marital status: Married    Spouse name: Not on file   Number of children: Not on file   Years of education: Not on file   Highest education level: Not on file  Occupational History   Not on file  Tobacco Use   Smoking status: Former    Current packs/day: 0.00    Average packs/day: 3.0 packs/day for 10.0 years (30.0 ttl pk-yrs)    Types: Cigarettes    Start date: 36    Quit date: 19    Years since  quitting: 44.0   Smokeless tobacco: Never  Vaping Use   Vaping status: Never Used  Substance and Sexual Activity   Alcohol use: Not Currently   Drug use: Never   Sexual activity: Not on file  Other Topics Concern   Not on file  Social History Narrative   Lives with wife   Social Drivers of Health   Financial Resource Strain: Not on file  Food Insecurity: Not on file  Transportation Needs: Not on file  Physical  Activity: Not on file  Stress: Not on file  Social Connections: Unknown (04/04/2022)   Received from [facility] [place]   Social Network    Social Network: Not on file  Intimate Partner Violence: Unknown (02/24/2022)   Received from [facility], [facility]   HITS    Physically Hurt: Not on file    Insult or Talk Down To: Not on file    Threaten Physical Harm: Not on file    Scream or Curse: Not on file    FAMILY HISTORY: No family history on file.  ALLERGIES:  has no known allergies.  MEDICATIONS:  Current Outpatient Medications  Medication Sig Dispense Refill   albuterol  (VENTOLIN  HFA) 108 (90 Base) MCG/ACT inhaler Inhale 1-2 puffs into the lungs every 6 (six) hours as needed for wheezing or shortness of breath. 8 g 0   Calcium Carb-Cholecalciferol (CALCIUM-VITAMIN D3) 600-500 MG-UNIT CAPS      finasteride (PROSCAR) 5 MG tablet Take 5 mg by mouth daily.     fluticasone-salmeterol (ADVAIR  HFA) 230-21 MCG/ACT inhaler Inhale 2 puffs into the lungs 2 (two) times daily. 12 g 11   ipratropium-albuterol  (DUONEB) 0.5-2.5 (3) MG/3ML SOLN Inhale 3 mLs into the lungs every 6 (six) hours as needed (shortness of breath or wheezing). 360 mL 5   lisinopril (ZESTRIL) 20 MG tablet Take 1 tablet by mouth daily.     montelukast (SINGULAIR) 10 MG tablet Take 1 tablet by mouth daily.     sildenafil (VIAGRA) 100 MG tablet Take 100 mg by mouth daily as needed.     tamsulosin (FLOMAX) 0.4 MG CAPS capsule Take 0.4 mg by mouth daily.     verapamil (VERELAN PM) 240 MG 24 hr capsule Take 240 mg by mouth daily.     Vitamin D, Ergocalciferol, (DRISDOL) 1.25 MG (50000 UNIT) CAPS capsule Take 1 capsule by mouth once a week.     No current facility-administered medications for this visit.    REVIEW OF SYSTEMS:   All relevant systems were reviewed with the patient and are negative.  PHYSICAL EXAMINATION: ECOG PERFORMANCE STATUS: {CHL ONC ECOG PS:[id number]}  There were no vitals filed for this  visit. There were no vitals filed for this visit.  GENERAL: alert, no distress and comfortable SKIN: skin color is normal, no jaundice, rashes EYES: sclera clear OROPHARYNX: no exudate, no erythema NECK: supple LYMPH:  no palpable lymphadenopathy in the cervical, axillary regions LUNGS: Effort normal, no respiratory distress.  Clear to auscultation bilaterally HEART: regular rate & rhythm and no lower extremity edema ABDOMEN: soft, non-tender and nondistended Musculoskeletal: no point tenderness NEURO: no focal motor/sensory deficits  LABORATORY DATA:  I have reviewed the data as listed Lab Results  Component Value Date   WBC 9.2 05/23/2021   HGB 13.0 05/23/2021   HCT 41.3 05/23/2021   MCV 101.5 (H) 05/23/2021   PLT 245 05/23/2021   No results for input(s): NA, K, CL, CO2, GLUCOSE, BUN, CREATININE, CALCIUM, GFRNONAA, GFRAA, PROT, ALBUMIN, AST,  ALT, ALKPHOS, BILITOT, BILIDIR, IBILI in the last 8760 hours.  RADIOGRAPHIC STUDIES: I have personally reviewed the radiological images as listed and agreed with the findings in the report. No results found.

## 2023-12-10 ENCOUNTER — Inpatient Hospital Stay: Payer: Medicare Other

## 2023-12-10 DIAGNOSIS — M899 Disorder of bone, unspecified: Secondary | ICD-10-CM

## 2023-12-11 ENCOUNTER — Telehealth: Payer: Self-pay

## 2023-12-11 ENCOUNTER — Encounter: Payer: Self-pay | Admitting: Urology

## 2023-12-13 NOTE — Progress Notes (Signed)
[facility] CONSULT NOTE  Patient Care Team: Melida Quitter, MD as PCP - General (Internal Medicine)  ASSESSMENT & PLAN:  Edward Hurst is a 83 y.o.male with history of asthma, hypertension, BPH being seen at [facility] for lytic lesions and questionable history of prostate cancer.  Diagnosis is not clear at this time.  CT showed prostamegaly and reported bone lesions in the right iliac bone questioning about metastatic lesion versus myeloma.  Etiology is unclear at this time.  Given PSA not significantly elevated and nondiagnostic with lytic lesion, recommend further work up including rule out myeloma with labs, biopsy and additional imaging.  Recommend obtain bone scan and multiple myeloma bone survey for lytic lesion.  Patient will follow-up in about 3 weeks after testing completed and results available for discussion.  Discussed we may need a biopsy.  He understands.  Orders as below.  Orders Placed This Encounter  Procedures   NM Bone Scan Whole Body    Standing Status:   Future    Expected Date:   12/21/2023    Expiration Date:   12/13/2024    Scheduling Instructions:     Please obtain CT abd/pelvis from [facility] 10/23/23 for comparison    If indicated for the ordered procedure, I authorize the administration of a radiopharmaceutical per Radiology protocol:   Yes    Preferred imaging location?:   [facility]   DG Bone Survey Met    Standing Status:   Future    Expected Date:   12/21/2023    Expiration Date:   12/13/2024    Scheduling Instructions:     Please obtain CT abd/pelvis from [facility] 10/23/23 for comparison    Reason for Exam (SYMPTOM  OR DIAGNOSIS REQUIRED):   assess for pelvic bone lesion ? myeloma lesion from [facility] CT scan    Preferred imaging location?:   [facility]   CBC with Differential (Cancer Center Only)    Standing Status:   Future    Expiration Date:   12/13/2024   CMP (Cancer Center only)     Standing Status:   Future    Expiration Date:   12/13/2024   Lactate dehydrogenase    Standing Status:   Future    Expiration Date:   12/13/2024   Serum protein electrophoresis with reflex    Standing Status:   Future    Expiration Date:   12/13/2024   Kappa/lambda light chains    Standing Status:   Future    Expiration Date:   12/13/2024   IgG, IgA, IgM    Standing Status:   Future    Expiration Date:   12/13/2024   Prostate-Specific AG, Serum    Standing Status:   Future    Expiration Date:   12/13/2024   Testosterone    Standing Status:   Future    Expiration Date:   12/13/2024   CEA (Access)-[facility] ONLY    Standing Status:   Future    Expiration Date:   12/13/2024   CA 19.9    Standing Status:   Future    Expiration Date:   12/13/2024   All questions were answered. The patient knows to call the clinic with any problems, questions or concerns. No barriers to learning was detected.  [name], MD 1/24/202510:39 AM  CHIEF COMPLAINTS/PURPOSE OF CONSULTATION:  Prostate cancer  HISTORY OF PRESENTING ILLNESS:  Edward Hurst 83 y.o. male is here because of prostate cancer.  Patient presented with microscopic hematuria and lower urinary tract symptoms in October 2024.  Workup showed 2 subcentimeter sized lytic lesions in the right iliac bone with thinning of the overlying cortex on CT scan.  CT also reported 1.3 x 2.1 cm irregular area over the left lung lower lobe favoring fibrosis/scarring likely present in 2021.  Cystoscopy reported negative for bladder mass, enlarged prostate.    Wife reports he had prostate biopsy in [place] maybe 10 years ago. He then had some type of pills he cannot remember exactly. He says they moved around and cannot remember ever follow up again.  He denies bloody urine other than from cystoscopy. No new bone, back, hip, rib pain. His appetite has decreased. No stomach pain, chest pain, nausea, vomiting, coughing or short of breath.  About 25 years ago he  had surgery on the left lung he thinks was resection on part of lung from smoking. He denies it was lung cancer.  No new headache, visual change. 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   09/14/23 creatinine 0.8  10/23/23 CT AP: 2 subcentimeter sized lytic lesions in the right iliac bone with thinning of the overlying cortex .1.3 x 2.1 cm irregular area over the left lung lower lobe favoring fibrosis/scarring likely present in 2021.    11/28/23 Cystoscopy reported negative for bladder mass, enlarged prostate.   PSA 3.4   I have reviewed his chart and materials related to his cancer extensively and collaborated history with the patient. Summary of oncologic history is as follows: Oncology History   No history exists.    MEDICAL HISTORY:  Past Medical History:  Diagnosis Date   Asthma    Chronic headache    Hyperlipidemia    Hypertension    Sleep apnea     SURGICAL HISTORY: No past surgical history on file.  SOCIAL HISTORY: Social History   Socioeconomic History   Marital status: Married    Spouse name: Not on file   Number of children: Not on file   Years of education: Not on file   Highest education level: Not on file  Occupational History   Not on file  Tobacco Use   Smoking status: Former    Current packs/day: 0.00    Average packs/day: 3.0 packs/day for 10.0 years (30.0 ttl pk-yrs)    Types: Cigarettes    Start date: 60    Quit date: 30    Years since quitting: 44.0   Smokeless tobacco: Never  Vaping Use   Vaping status: Never Used  Substance and Sexual Activity   Alcohol use: Not Currently   Drug use: Never   Sexual activity: Not on file  Other Topics Concern   Not on file  Social History Narrative   Lives with wife   Social Drivers of Corporate investment banker Strain: Not on file  Food Insecurity: Not on file  Transportation Needs: Not on file  Physical Activity: Not on file  Stress: Not on file  Social Connections:  Unknown (04/04/2022)   Received from [facility], [facility]   Social Network    Social Network: Not on file  Intimate Partner Violence: Unknown (02/24/2022)   Received from [facility], [facility]   HITS    Physically Hurt: Not on file    Insult or Talk Down To: Not on file    Threaten Physical Harm: Not on file    Scream or Curse: Not on file  FAMILY HISTORY: No family history on file.  ALLERGIES:  has no known allergies.  MEDICATIONS:  Current Outpatient Medications  Medication Sig Dispense Refill   albuterol (VENTOLIN HFA) 108 (90 Base) MCG/ACT inhaler Inhale 1-2 puffs into the lungs every 6 (six) hours as needed for wheezing or shortness of breath. 8 g 0   Calcium Carb-Cholecalciferol (CALCIUM-VITAMIN D3) 600-500 MG-UNIT CAPS      finasteride (PROSCAR) 5 MG tablet Take 5 mg by mouth daily.     fluticasone-salmeterol (ADVAIR HFA) 230-21 MCG/ACT inhaler Inhale 2 puffs into the lungs 2 (two) times daily. 12 g 11   ipratropium-albuterol (DUONEB) 0.5-2.5 (3) MG/3ML SOLN Inhale 3 mLs into the lungs every 6 (six) hours as needed (shortness of breath or wheezing). (Patient not taking: Reported on 12/14/2023) 360 mL 5   lisinopril (ZESTRIL) 20 MG tablet Take 1 tablet by mouth daily.     montelukast (SINGULAIR) 10 MG tablet Take 1 tablet by mouth daily.     sildenafil (VIAGRA) 100 MG tablet Take 100 mg by mouth daily as needed.     tamsulosin (FLOMAX) 0.4 MG CAPS capsule Take 0.4 mg by mouth daily.     verapamil (VERELAN PM) 240 MG 24 hr capsule Take 240 mg by mouth daily.     No current facility-administered medications for this visit.    REVIEW OF SYSTEMS:   All relevant systems were reviewed with the patient and are negative.  PHYSICAL EXAMINATION: ECOG PERFORMANCE STATUS: 2 - Symptomatic, <50% confined to bed  Vitals:   12/14/23 0933  BP: (!) 139/98  Pulse: 69  Resp: 15  Temp: 97.9 F (36.6 C)  SpO2: 100%   Filed Weights   12/14/23 0933  Weight: 157 lb 14.4  oz (71.6 kg)    GENERAL: alert, no distress and comfortable SKIN: skin color is normal EYES: sclera clear LYMPH:  no palpable lymphadenopathy in the cervical, axillary regions LUNGS: Effort normal, no respiratory distress.  Clear to auscultation bilaterally HEART: regular rate & rhythm ABDOMEN: soft, non-tender and nondistended  LABORATORY DATA:  I have reviewed the data as listed Lab Results  Component Value Date   WBC 9.2 05/23/2021   HGB 13.0 05/23/2021   HCT 41.3 05/23/2021   MCV 101.5 (H) 05/23/2021   PLT 245 05/23/2021   No results for input(s): "NA", "K", "CL", "CO2", "GLUCOSE", "BUN", "CREATININE", "CALCIUM", "GFRNONAA", "GFRAA", "PROT", "ALBUMIN", "AST", "ALT", "ALKPHOS", "BILITOT", "BILIDIR", "IBILI" in the last 8760 hours.  RADIOGRAPHIC STUDIES: I have personally reviewed the radiological images as listed and agreed with the findings in the report. No results found.

## 2023-12-14 ENCOUNTER — Inpatient Hospital Stay: Payer: Medicare Other

## 2023-12-14 ENCOUNTER — Other Ambulatory Visit: Payer: Self-pay

## 2023-12-14 VITALS — BP 139/98 | HR 69 | Temp 97.9°F | Resp 15 | Ht 69.0 in | Wt 157.9 lb

## 2023-12-14 DIAGNOSIS — M899 Disorder of bone, unspecified: Secondary | ICD-10-CM | POA: Diagnosis not present

## 2023-12-14 DIAGNOSIS — I10 Essential (primary) hypertension: Secondary | ICD-10-CM | POA: Insufficient documentation

## 2023-12-14 DIAGNOSIS — E785 Hyperlipidemia, unspecified: Secondary | ICD-10-CM | POA: Diagnosis not present

## 2023-12-14 DIAGNOSIS — C61 Malignant neoplasm of prostate: Secondary | ICD-10-CM | POA: Diagnosis not present

## 2023-12-14 DIAGNOSIS — J45909 Unspecified asthma, uncomplicated: Secondary | ICD-10-CM | POA: Insufficient documentation

## 2023-12-14 DIAGNOSIS — M1712 Unilateral primary osteoarthritis, left knee: Secondary | ICD-10-CM | POA: Insufficient documentation

## 2023-12-14 DIAGNOSIS — Z7951 Long term (current) use of inhaled steroids: Secondary | ICD-10-CM | POA: Insufficient documentation

## 2023-12-14 DIAGNOSIS — Z79899 Other long term (current) drug therapy: Secondary | ICD-10-CM | POA: Diagnosis not present

## 2023-12-14 LAB — CBC WITH DIFFERENTIAL (CANCER CENTER ONLY)
Abs Immature Granulocytes: 0.02 10*3/uL (ref 0.00–0.07)
Basophils Absolute: 0.1 10*3/uL (ref 0.0–0.1)
Basophils Relative: 1 %
Eosinophils Absolute: 0.2 10*3/uL (ref 0.0–0.5)
Eosinophils Relative: 2 %
HCT: 35.7 % — ABNORMAL LOW (ref 39.0–52.0)
Hemoglobin: 11.9 g/dL — ABNORMAL LOW (ref 13.0–17.0)
Immature Granulocytes: 0 %
Lymphocytes Relative: 19 %
Lymphs Abs: 1.4 10*3/uL (ref 0.7–4.0)
MCH: 31.2 pg (ref 26.0–34.0)
MCHC: 33.3 g/dL (ref 30.0–36.0)
MCV: 93.7 fL (ref 80.0–100.0)
Monocytes Absolute: 0.5 10*3/uL (ref 0.1–1.0)
Monocytes Relative: 7 %
Neutro Abs: 5.4 10*3/uL (ref 1.7–7.7)
Neutrophils Relative %: 71 %
Platelet Count: 208 10*3/uL (ref 150–400)
RBC: 3.81 MIL/uL — ABNORMAL LOW (ref 4.22–5.81)
RDW: 13.9 % (ref 11.5–15.5)
WBC Count: 7.6 10*3/uL (ref 4.0–10.5)
nRBC: 0 % (ref 0.0–0.2)

## 2023-12-14 LAB — CMP (CANCER CENTER ONLY)
ALT: 20 U/L (ref 0–44)
AST: 25 U/L (ref 15–41)
Albumin: 4.1 g/dL (ref 3.5–5.0)
Alkaline Phosphatase: 58 U/L (ref 38–126)
Anion gap: 4 — ABNORMAL LOW (ref 5–15)
BUN: 16 mg/dL (ref 8–23)
CO2: 31 mmol/L (ref 22–32)
Calcium: 9.5 mg/dL (ref 8.9–10.3)
Chloride: 104 mmol/L (ref 98–111)
Creatinine: 0.77 mg/dL (ref 0.61–1.24)
GFR, Estimated: >60 mL/min (ref >60)
Glucose, Bld: 107 mg/dL — ABNORMAL HIGH (ref 70–99)
Potassium: 4.3 mmol/L (ref 3.5–5.1)
Sodium: 139 mmol/L (ref 135–145)
Total Bilirubin: 0.6 mg/dL (ref 0.0–1.2)
Total Protein: 7 g/dL (ref 6.5–8.1)

## 2023-12-14 LAB — VITAMIN B12: Vitamin B-12: 438 pg/mL (ref 180–914)

## 2023-12-14 LAB — LACTATE DEHYDROGENASE: LDH: 270 U/L — ABNORMAL HIGH (ref 98–192)

## 2023-12-14 LAB — FOLATE: Folate: 27.1 ng/mL (ref >5.9)

## 2023-12-14 LAB — CEA (ACCESS): CEA (CHCC): 2.36 ng/mL (ref 0.00–5.00)

## 2023-12-14 NOTE — Progress Notes (Signed)
Pt was originally scheduled on 12/10/23. This RN documented in this patient's chart in error on this date--intended for another patient. Therefore, disregard all entries for 12/10/23.

## 2023-12-14 NOTE — Patient Instructions (Signed)
You were referred here due to findings of lesions on the bone. We will obtain blood testing today. You will received call from Radiology to schedule bone scan and X-rays. Please follow up about 2 weeks after your scan.

## 2023-12-16 LAB — IGG, IGA, IGM
IgA: 244 mg/dL (ref 61–437)
IgG (Immunoglobin G), Serum: 1154 mg/dL (ref 603–1613)
IgM (Immunoglobulin M), Srm: 75 mg/dL (ref 15–143)

## 2023-12-16 LAB — CANCER ANTIGEN 19-9: CA 19-9: 16 U/mL (ref 0–35)

## 2023-12-16 LAB — TESTOSTERONE: Testosterone: 482 ng/dL (ref 264–916)

## 2023-12-16 LAB — PROSTATE-SPECIFIC AG, SERUM (LABCORP): Prostate Specific Ag, Serum: 6.2 ng/mL — ABNORMAL HIGH (ref 0.0–4.0)

## 2023-12-17 LAB — KAPPA/LAMBDA LIGHT CHAINS
Kappa free light chain: 23.7 mg/L — ABNORMAL HIGH (ref 3.3–19.4)
Kappa, lambda light chain ratio: 2.14 — ABNORMAL HIGH (ref 0.26–1.65)
Lambda free light chains: 11.1 mg/L (ref 5.7–26.3)

## 2023-12-20 ENCOUNTER — Telehealth: Payer: Self-pay

## 2023-12-20 LAB — PROTEIN ELECTROPHORESIS, SERUM, WITH REFLEX
A/G Ratio: 1.2 (ref 0.7–1.7)
Albumin ELP: 3.5 g/dL (ref 2.9–4.4)
Alpha-1-Globulin: 0.2 g/dL (ref 0.0–0.4)
Alpha-2-Globulin: 0.6 g/dL (ref 0.4–1.0)
Beta Globulin: 0.9 g/dL (ref 0.7–1.3)
Gamma Globulin: 1.1 g/dL (ref 0.4–1.8)
Globulin, Total: 2.9 g/dL (ref 2.2–3.9)
Total Protein ELP: 6.4 g/dL (ref 6.0–8.5)

## 2023-12-20 NOTE — Telephone Encounter (Signed)
TC to pt to discuss the below message by Dr. Cherly Hensen. No answer; left VM, asking pt to call Dr. Nelta Numbers office.

## 2023-12-20 NOTE — Telephone Encounter (Signed)
-----   Message from [name] sent at 12/20/2023  4:41 PM EST ----- Angelica Chessman would you follow up if they able to get the scans scheduled? A bone scan and DR bone survey to rule out lesions in the bones. Thanks.

## 2023-12-27 ENCOUNTER — Encounter (HOSPITAL_COMMUNITY)
Admission: RE | Admit: 2023-12-27 | Discharge: 2023-12-27 | Disposition: A | Discharge status: Home / Self Care | Payer: Medicare Other | Source: Ambulatory Visit

## 2023-12-27 DIAGNOSIS — M899 Disorder of bone, unspecified: Secondary | ICD-10-CM | POA: Diagnosis present

## 2023-12-27 MED ORDER — TECHNETIUM TC 99M MEDRONATE IV KIT
20.0000 | PACK | Freq: Once | INTRAVENOUS | Status: AC | PRN
  Administered 2023-12-27: 20.8 via INTRAVENOUS

## 2023-12-30 NOTE — Progress Notes (Deleted)
 Patient Care Team: Lourena Simmonds, Donald Pore, MD as PCP - General (Family Medicine)  Clinic Day:  12/30/2023  Referring physician: Melida Quitter, MD  ASSESSMENT & PLAN:   Assessment & Plan: Edward Hurst is a 83 y.o.male with history of asthma, hypertension, BPH being [facility] at [facility] for lytic lesions and questionable history of prostate cancer.   Diagnosis is not clear at this time.  CT showed prostamegaly and reported bone lesions in the right iliac bone questioning about metastatic lesion versus myeloma.  Etiology is unclear at this time.  Given PSA not significantly elevated and nondiagnostic with lytic lesion, recommend further work up including rule out myeloma with labs, biopsy and additional imaging.  Recommend obtain bone scan and multiple myeloma bone survey for lytic lesion.  Bone scan done and pending final read and bone survey pending.  Recommend biopsy pending results.   Patient will follow-up in about 3 weeks after testing completed and results available for discussion.  Discussed we may need a biopsy.  He understands.  No problem-specific Assessment & Plan notes found for this encounter.    The patient understands the plans discussed today and is in agreement with them.  He knows to contact our office if he develops concerns prior to his next appointment.  [name], MD   CANCER CENTER [facility] - A DEPT [facility] [address] [place] [address] Dept: [phone] Dept Fax: [phone]   No orders of the defined types were placed in this encounter.     CHIEF COMPLAINT:  CC: ***  Current Treatment:  ***  INTERVAL HISTORY:  Edward Hurst is here today for repeat clinical assessment. He denies fevers or chills. He denies pain. His appetite is good. His weight {Weight change:10426}.  I have reviewed the past medical history, past surgical history, social history and family history with the patient  and they are unchanged from previous note.  ALLERGIES:  has no known allergies.  MEDICATIONS:  Current Outpatient Medications  Medication Sig Dispense Refill   albuterol (VENTOLIN HFA) 108 (90 Base) MCG/ACT inhaler Inhale 1-2 puffs into the lungs every 6 (six) hours as needed for wheezing or shortness of breath. 8 g 0   Calcium Carb-Cholecalciferol (CALCIUM-VITAMIN D3) 600-500 MG-UNIT CAPS      finasteride (PROSCAR) 5 MG tablet Take 5 mg by mouth daily.     fluticasone-salmeterol (ADVAIR HFA) 230-21 MCG/ACT inhaler Inhale 2 puffs into the lungs 2 (two) times daily. 12 g 11   ipratropium-albuterol (DUONEB) 0.5-2.5 (3) MG/3ML SOLN Inhale 3 mLs into the lungs every 6 (six) hours as needed (shortness of breath or wheezing). (Patient not taking: Reported on 12/14/2023) 360 mL 5   lisinopril (ZESTRIL) 20 MG tablet Take 1 tablet by mouth daily.     montelukast (SINGULAIR) 10 MG tablet Take 1 tablet by mouth daily.     sildenafil (VIAGRA) 100 MG tablet Take 100 mg by mouth daily as needed.     tamsulosin (FLOMAX) 0.4 MG CAPS capsule Take 0.4 mg by mouth daily.     verapamil (VERELAN PM) 240 MG 24 hr capsule Take 240 mg by mouth daily.     No current facility-administered medications for this visit.    HISTORY OF PRESENT ILLNESS:   Oncology History   No history exists.      REVIEW OF SYSTEMS:   All relevant systems were reviewed with the patient and are negative.   VITALS:  There were no vitals taken for this visit.  Wt Readings from Last 3 Encounters:  12/14/23 157 lb 14.4 oz (71.6 kg)  09/15/21 169 lb (76.7 kg)  05/31/21 169 lb 12.8 oz (77 kg)    There is no height or weight on file to calculate BMI.  Performance status (ECOG): {CHL ONC [id number]  PHYSICAL EXAM:   GENERAL:alert, no distress and comfortable SKIN: skin color normal, no rashes  EYES: normal, sclera clear OROPHARYNX: no exudate, no erythema    NECK: supple,  non-tender, without nodularity LYMPH:  no  palpable cervical lymphadenopathy LUNGS: clear to auscultation with normal breathing effort.  No wheeze or rales HEART: regular rate & rhythm and no murmurs and no lower extremity edema ABDOMEN: abdomen soft, non-tender and nondistended Musculoskeletal: no edema NEURO: alert, fluent speech, no focal motor/sensory deficits.  Strength and sensation equal bilaterally.  LABORATORY DATA:  I have reviewed the data as listed    Component Value Date/Time   NA 139 12/14/2023 1113   K 4.3 12/14/2023 1113   CL 104 12/14/2023 1113   CO2 31 12/14/2023 1113   GLUCOSE 107 (H) 12/14/2023 1113   BUN 16 12/14/2023 1113   CREATININE 0.77 12/14/2023 1113   CALCIUM 9.5 12/14/2023 1113   PROT 7.0 12/14/2023 1113   ALBUMIN 4.1 12/14/2023 1113   AST 25 12/14/2023 1113   ALT 20 12/14/2023 1113   ALKPHOS 58 12/14/2023 1113   BILITOT 0.6 12/14/2023 1113   GFRNONAA >60 12/14/2023 1113   GFRAA >60 05/14/2020 1520    Lab Results  Component Value Date   SPEP Comment 12/14/2023    Lab Results  Component Value Date   WBC 7.6 12/14/2023   NEUTROABS 5.4 12/14/2023   HGB 11.9 (L) 12/14/2023   HCT 35.7 (L) 12/14/2023   MCV 93.7 12/14/2023   PLT 208 12/14/2023      Chemistry      Component Value Date/Time   NA 139 12/14/2023 1113   K 4.3 12/14/2023 1113   CL 104 12/14/2023 1113   CO2 31 12/14/2023 1113   BUN 16 12/14/2023 1113   CREATININE 0.77 12/14/2023 1113      Component Value Date/Time   CALCIUM 9.5 12/14/2023 1113   ALKPHOS 58 12/14/2023 1113   AST 25 12/14/2023 1113   ALT 20 12/14/2023 1113   BILITOT 0.6 12/14/2023 1113       RADIOGRAPHIC STUDIES: I have personally reviewed the radiological images as listed and agreed with the findings in the report. No results found.

## 2023-12-31 ENCOUNTER — Inpatient Hospital Stay: Payer: Medicare Other

## 2024-01-01 ENCOUNTER — Telehealth: Payer: Self-pay | Admitting: *Deleted

## 2024-01-01 ENCOUNTER — Telehealth: Payer: Self-pay

## 2024-01-01 NOTE — Telephone Encounter (Signed)
Notified of message below

## 2024-01-01 NOTE — Telephone Encounter (Signed)
LM to call Dr Nelta Numbers nurse

## 2024-01-01 NOTE — Telephone Encounter (Signed)
-----   Message from [name] sent at 01/01/2024 12:35 PM EST ----- Please let him know the bone scan did not show specific findings of cancer. We do need the bone Survey to assess. Please have him schedule for this thanks.

## 2024-01-09 NOTE — Progress Notes (Unsigned)
 Patient Care Team: Lourena Simmonds, Donald Pore, MD as PCP - General (Family Medicine)  Clinic Day:  01/10/2024  Referring physician: Lourena Simmonds, Donald Pore, *  ASSESSMENT & PLAN:   Assessment & Plan: [name] is a 83 y.o.male with history of asthma, hypertension, BPH being [facility] for lytic lesions and questionable history of prostate cancer.   Diagnosis is not clear at this time.  CT showed prostamegaly and reported bone lesions in the right iliac bone questioning about metastatic lesion versus myeloma.  After first visit, recommend bone scan which did not show clear metastases. No specific abnormal uptake corresponding to the lucent lesions along the right iliac bone. Recommend myeloma bone survey.  Given PSA only mildly elevated this is not diagnostic. Obtain MRI prostate.  Patient will follow-up in about a few weeks after testing completed and results available for discussion.    Discussed we will need more testing for the diagnosis and gave wife the # of radiology to call today. He understands.  Elevated PSA Will obtain MRI of prostate  Bone lesion Will obtain myeloma survey  BPH (benign prostatic hyperplasia) Flomax and finasteride not helping. Will ask Urology to see again.   Will tentatively follow up in about 3-4 weeks.  Total time 31 minutes.  The patient understands the plans discussed today and is in agreement with them.  He knows to contact our office if he develops concerns prior to his next appointment.  [name], MD  [facility] - A DEPT OF [facility] [address] [place] [address] Dept: [phone] Dept Fax: [phone]   Orders Placed This Encounter  Procedures   MR PROSTATE W WO CONTRAST    Standing Status:   Future    Expected Date:   01/17/2024    Expiration Date:   01/09/2025    If indicated for the ordered procedure, I authorize the administration of  contrast media per Radiology protocol:   Yes    What is the patient's sedation requirement?:   No Sedation    Does the patient have a pacemaker or implanted devices?:   No    Preferred imaging location?:   [facility], [place] (table limit - 550 lbs)      CHIEF COMPLAINT:  CC: bone lesion  INTERVAL HISTORY:  Edward Hurst is here today for follow up with his wife. [name] is here because of prostate cancer.   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 No new bone or back pain. No new new hip pain. No trouble breathing. No hematuria, dysuria. Some incontinence. He gets up 2-3 times a night. He is now reporting he broke both hips falling off chain about 10 years ago while in [place].  Initial history: Patient presented with microscopic hematuria and lower urinary tract symptoms in October 2024.  Workup showed 2 subcentimeter sized lytic lesions in the right iliac bone with thinning of the overlying cortex on CT scan.  CT also reported 1.3 x 2.1 cm irregular area over the left lung lower lobe favoring fibrosis/scarring likely present in 2021.  Cystoscopy reported negative for bladder mass, enlarged prostate.     Wife reports he had prostate biopsy in [place] maybe 10 years ago. He then had some type of pills he cannot remember exactly. He says they moved around and cannot remember ever follow up again.  He denies bloody urine other than from cystoscopy. No new bone, back, hip, rib pain. His appetite has decreased. No stomach pain, chest pain, nausea, vomiting, coughing or short of breath.   About 25 years ago he had surgery on the left lung he thinks was resection on part of lung from smoking. He denies it was lung cancer.   No new headache, visual change. 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    09/14/23 creatinine 0.8   10/23/23 CT AP: 2 subcentimeter sized lytic lesions in the right iliac bone with thinning of the  overlying cortex .1.3 x 2.1 cm irregular area over the left lung lower lobe favoring fibrosis/scarring likely present in 2021.     11/28/23 Cystoscopy reported negative for bladder mass, enlarged prostate.   PSA 3.4    ALLERGIES:  has no known allergies.  MEDICATIONS:  Current Outpatient Medications  Medication Sig Dispense Refill   albuterol (VENTOLIN HFA) 108 (90 Base) MCG/ACT inhaler Inhale 1-2 puffs into the lungs every 6 (six) hours as needed for wheezing or shortness of breath. 8 g 0   Calcium Carb-Cholecalciferol (CALCIUM-VITAMIN D3) 600-500 MG-UNIT CAPS      finasteride (PROSCAR) 5 MG tablet Take 5 mg by mouth daily.     fluticasone-salmeterol (ADVAIR HFA) 230-21 MCG/ACT inhaler Inhale 2 puffs into the lungs 2 (two) times daily. 12 g 11   ipratropium-albuterol (DUONEB) 0.5-2.5 (3) MG/3ML SOLN Inhale 3 mLs into the lungs every 6 (six) hours as needed (shortness of breath or wheezing). (Patient not taking: Reported on 12/14/2023) 360 mL 5   lisinopril (ZESTRIL) 20 MG tablet Take 1 tablet by mouth daily.     montelukast (SINGULAIR) 10 MG tablet Take 1 tablet by mouth daily.     sildenafil (VIAGRA) 100 MG tablet Take 100 mg by mouth daily as needed.     tamsulosin (FLOMAX) 0.4 MG CAPS capsule Take 0.4 mg by mouth daily.     verapamil (VERELAN PM) 240 MG 24 hr capsule Take 240 mg by mouth daily.     No current facility-administered medications for this visit.   Past Medical History:  Diagnosis Date   Asthma    Chronic headache    Hyperlipidemia    Hypertension    Sleep apnea      REVIEW OF SYSTEMS:   All relevant systems were reviewed with the patient and are negative.   VITALS:  Blood pressure (!) 150/86, pulse 65, temperature 98.8 F (37.1 C), temperature source Temporal, resp. rate 19, height 5\' 9"  (1.753 m), weight 154 lb 8 oz (70.1 kg), SpO2 100%.  Wt Readings from Last 3 Encounters:  01/10/24 154 lb 8 oz (70.1 kg)  12/14/23 157 lb 14.4 oz (71.6 kg)  09/15/21 169 lb  (76.7 kg)    Body mass index is 22.82 kg/m.  Performance status (ECOG): 2 - Symptomatic, <50% confined to bed  PHYSICAL EXAM:   GENERAL:alert, no distress and comfortable SKIN: skin color normal, no rashes  EYES: normal, sclera clear LUNGS: clear to auscultation with normal breathing effort.  No wheeze or rales HEART: regular rate & rhythm ABDOMEN: abdomen soft, non-tender and nondistended Musculoskeletal: no edema NEURO: alert and oriented x 3, fluent speech, no focal motor/sensory deficits.  Strength and sensation equal bilaterally.  LABORATORY DATA:  I have reviewed the data as listed    Component Value Date/Time   NA 139 12/14/2023 1113   K 4.3 12/14/2023 1113   CL 104 12/14/2023 1113  CO2 31 12/14/2023 1113   GLUCOSE 107 (H) 12/14/2023 1113   BUN 16 12/14/2023 1113   CREATININE 0.77 12/14/2023 1113   CALCIUM 9.5 12/14/2023 1113   PROT 7.0 12/14/2023 1113   ALBUMIN 4.1 12/14/2023 1113   AST 25 12/14/2023 1113   ALT 20 12/14/2023 1113   ALKPHOS 58 12/14/2023 1113   BILITOT 0.6 12/14/2023 1113   GFRNONAA >60 12/14/2023 1113   GFRAA >60 05/14/2020 1520    Lab Results  Component Value Date   SPEP Comment 12/14/2023    Lab Results  Component Value Date   WBC 7.6 12/14/2023   NEUTROABS 5.4 12/14/2023   HGB 11.9 (L) 12/14/2023   HCT 35.7 (L) 12/14/2023   MCV 93.7 12/14/2023   PLT 208 12/14/2023      Chemistry      Component Value Date/Time   NA 139 12/14/2023 1113   K 4.3 12/14/2023 1113   CL 104 12/14/2023 1113   CO2 31 12/14/2023 1113   BUN 16 12/14/2023 1113   CREATININE 0.77 12/14/2023 1113      Component Value Date/Time   CALCIUM 9.5 12/14/2023 1113   ALKPHOS 58 12/14/2023 1113   AST 25 12/14/2023 1113   ALT 20 12/14/2023 1113   BILITOT 0.6 12/14/2023 1113       RADIOGRAPHIC STUDIES: I have personally reviewed the radiological images as listed and agreed with the findings in the report. NM Bone Scan Whole Body Result Date:  01/01/2024 CLINICAL DATA:  Incidental bone lesion EXAM: NUCLEAR MEDICINE WHOLE BODY BONE SCAN TECHNIQUE: Whole body anterior and posterior images were obtained approximately 3 hours after intravenous injection of radiopharmaceutical. RADIOPHARMACEUTICALS:  20.8 mCi Technetium-30m MDP IV COMPARISON:  CT abdomen pelvis 10/23/2023. FINDINGS: Physiologic distribution is seen of radiotracer in the kidneys and bladder. Multifocal areas of degenerative uptake identified including the knees, shoulders, lumbar spine. There is also uptake along the left femoral neck but patient has a known dynamic hip screw along both hips. Mild uptake as well along the shaft of the right femur also consistent with the patient's hardware. There is some asymmetric uptake along the left femoral distal metaphysis. Is also uptake along the proximal right humeral shaft. No corresponding abnormal uptake in the area of the lucent lesion seen by CT scan along the right iliac bone. IMPRESSION: Multifocal degenerative changes identified as well as uptake corresponding to the area of femoral hardware. No specific abnormal uptake corresponding to the lucent lesions along the right iliac bone. Please correlate for any known history. These are cortical to subcortical lesions. Please correlate for any history of myeloma. Slight nodular areas of uptake along the distal left femoral metaphysis. There is also an area of focal uptake along the proximal right humeral shaft. Recommend dedicated right humeral and left femoral x-rays to correlate with these findings. Electronically Signed   By: Karen Kays M.D.   On: 01/01/2024 12:10

## 2024-01-10 ENCOUNTER — Inpatient Hospital Stay: Payer: Medicare Other

## 2024-01-10 VITALS — BP 150/86 | HR 65 | Temp 98.8°F | Resp 19 | Ht 69.0 in | Wt 154.5 lb

## 2024-01-10 DIAGNOSIS — J45909 Unspecified asthma, uncomplicated: Secondary | ICD-10-CM | POA: Diagnosis not present

## 2024-01-10 DIAGNOSIS — R351 Nocturia: Secondary | ICD-10-CM | POA: Diagnosis not present

## 2024-01-10 DIAGNOSIS — M899 Disorder of bone, unspecified: Secondary | ICD-10-CM | POA: Diagnosis not present

## 2024-01-10 DIAGNOSIS — C61 Malignant neoplasm of prostate: Secondary | ICD-10-CM | POA: Insufficient documentation

## 2024-01-10 DIAGNOSIS — I10 Essential (primary) hypertension: Secondary | ICD-10-CM | POA: Insufficient documentation

## 2024-01-10 DIAGNOSIS — R972 Elevated prostate specific antigen [PSA]: Secondary | ICD-10-CM | POA: Insufficient documentation

## 2024-01-10 DIAGNOSIS — N401 Enlarged prostate with lower urinary tract symptoms: Secondary | ICD-10-CM | POA: Insufficient documentation

## 2024-01-10 DIAGNOSIS — N400 Enlarged prostate without lower urinary tract symptoms: Secondary | ICD-10-CM | POA: Insufficient documentation

## 2024-01-10 NOTE — Assessment & Plan Note (Signed)
 Will obtain myeloma survey

## 2024-01-10 NOTE — Assessment & Plan Note (Signed)
 Will obtain MRI of prostate

## 2024-01-10 NOTE — Assessment & Plan Note (Addendum)
 Flomax and finasteride not helping. Will ask Urology to see again.

## 2024-01-19 ENCOUNTER — Ambulatory Visit (HOSPITAL_COMMUNITY): Admission: RE | Admit: 2024-01-19 | Payer: Medicare Other | Source: Ambulatory Visit

## 2024-01-21 NOTE — Progress Notes (Signed)
 Patient did not show on 3/1 for MR of Prostate.  RN reached out to patient/patients wife, left message for call back to help coordinate to get MR rescheduled.

## 2024-01-25 ENCOUNTER — Telehealth: Payer: Self-pay

## 2024-01-25 NOTE — Telephone Encounter (Signed)
Rescheduled patients appointments.

## 2024-01-29 ENCOUNTER — Inpatient Hospital Stay: Payer: Medicare Other

## 2024-01-30 ENCOUNTER — Ambulatory Visit (HOSPITAL_COMMUNITY): Admission: RE | Admit: 2024-01-30 | Source: Ambulatory Visit

## 2024-02-01 ENCOUNTER — Other Ambulatory Visit (HOSPITAL_COMMUNITY)

## 2024-02-05 ENCOUNTER — Ambulatory Visit (HOSPITAL_COMMUNITY)
Admission: RE | Admit: 2024-02-05 | Discharge: 2024-02-05 | Disposition: A | Discharge status: Home / Self Care | Source: Ambulatory Visit

## 2024-02-05 ENCOUNTER — Telehealth: Payer: Self-pay

## 2024-02-05 DIAGNOSIS — R972 Elevated prostate specific antigen [PSA]: Secondary | ICD-10-CM | POA: Insufficient documentation

## 2024-02-05 MED ORDER — GADOBUTROL 1 MMOL/ML IV SOLN
7.0000 mL | Freq: Once | INTRAVENOUS | Status: AC | PRN
  Administered 2024-02-05: 7 mL via INTRAVENOUS

## 2024-02-05 NOTE — Progress Notes (Signed)
 RN spoke with patient's wife to remind her of upcoming MR for patient today at 4:30.  RN reviewed instructions, verbal teach back.  RN put request to have bone survey rescheduled due to recent no show.  Patient is now scheduled for bone survey on 3/25.  Request for MD follow up to be rescheduled post 3/25 to have all imaging.     RN will follow up with patient and patients wife prior to 3/25 to confirm appointment.

## 2024-02-07 ENCOUNTER — Inpatient Hospital Stay

## 2024-02-12 ENCOUNTER — Inpatient Hospital Stay (HOSPITAL_COMMUNITY): Admission: RE | Admit: 2024-02-12 | Source: Ambulatory Visit

## 2024-02-12 NOTE — Progress Notes (Signed)
 RN spoke with patient's wife, and unfortunately they had forgot about appointment for bone survey.    RN reviewed MRI results with patient's wife, and informed that I would coordinate having bone survey rescheduled.   Verbalized understanding.  Once rescheduled I will plan on calling to review.

## 2024-02-14 ENCOUNTER — Inpatient Hospital Stay (HOSPITAL_COMMUNITY): Admission: RE | Admit: 2024-02-14 | Source: Ambulatory Visit

## 2024-02-14 NOTE — Progress Notes (Signed)
 RN spoke with patient's wife to remind of upcoming bone survey appointment today with arrival time of 1:45.   Patient's wife aware.

## 2024-02-15 ENCOUNTER — Ambulatory Visit (HOSPITAL_COMMUNITY)
Admission: RE | Admit: 2024-02-15 | Discharge: 2024-02-15 | Disposition: A | Discharge status: Home / Self Care | Source: Ambulatory Visit

## 2024-02-15 DIAGNOSIS — M899 Disorder of bone, unspecified: Secondary | ICD-10-CM | POA: Diagnosis present

## 2024-02-15 NOTE — Progress Notes (Signed)
 RN spoke with wife to remind her of upcoming Bone Survey scheduled for today with arrival time of 1:30pm.

## 2024-02-18 NOTE — Progress Notes (Unsigned)
 [facility] OFFICE PROGRESS NOTE  Patient Care Team: Corliss Blacker, MD as PCP - General (Family Medicine)  Edward Hurst is a 83 y.o.male with history of asthma, hypertension, BPH and memory loss being [facility] for lytic lesions and questionable history of prostate cancer.   Work up showed normal Cr, Ca, hgb >10, negative for M-proteins. Kappa LC and PSA were borderline elevated.  Discussed results. Bone scan was negative for avid lesions. MRI of prostate was negative for prostate lesion. Bone survey showed lytic lesions. Discussed ddx of malignancy like myeloma vs osteoporosis. Report his PS is still fair and walking outside his complex and not bound to chair significantly. Recommend Dexa scan and bone marrow biopsy. 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 ? malignancy, vs osteoporosis     [name], MD  INTERVAL HISTORY: Patient returns for follow-up with wife. Report still walking around the house outside, sometimes work on his car. No new pain. He has chronic arthritis in  the knees.  Bone survey: IMPRESSION: 1. Age-indeterminate mild compression fracture deformity of T6, new since 2022. Recommend correlation with point tenderness. 2. Questionable lucent lesion at the posteroinferior endplate of C4, RIGHT iliac crest, LEFT iliac crest and RIGHT proximal humerus. Recommend correlation with laboratory values if there is a clinical concern for multiple myeloma. Differential considerations include degenerative changes or osseous trabeculation heterogeneity due to osteopenia. If further imaging is desired, this would be better assessed with dedicated MRI with and without contrast given underlying osteopenia. 3. Status post ORIF of bilateral femurs. Trace lucencies noted abutting the RIGHT femoral head screw could reflect loosening.  Initial history  Patient presented with microscopic hematuria and lower urinary tract symptoms in October 2024.  Workup showed 2 subcentimeter sized lytic lesions in the right iliac bone with thinning of the overlying cortex on CT scan.  CT also reported 1.3 x 2.1 cm irregular area over the left lung lower lobe favoring fibrosis/scarring likely present in 2021.  Cystoscopy reported negative for bladder mass, enlarged prostate.     09/14/23 creatinine 0.8   10/23/23 CT AP: 2 subcentimeter sized lytic lesions in the right iliac bone with thinning of the overlying cortex .1.3 x 2.1 cm irregular area over the left lung lower lobe favoring fibrosis/scarring likely present in 2021.     11/28/23 Cystoscopy reported negative for bladder mass, enlarged prostate.   PSA 3.4  PHYSICAL EXAMINATION: ECOG PERFORMANCE STATUS: 2 -  Symptomatic, <50% confined to bed  Vitals:   02/19/24 1330  BP: 132/81  Pulse: 66  Resp: 18  Temp: 98.1 F (36.7 C)  SpO2: 100%   Filed Weights   02/19/24 1330  Weight: 160 lb 12.8 oz (72.9 kg)    Relevant data reviewed during this visit included labs and imaging.

## 2024-02-18 NOTE — Progress Notes (Signed)
 RN requested STAT read on bone survey for upcoming follow up with Dr. Cherly Hensen on 4/1.

## 2024-02-19 ENCOUNTER — Other Ambulatory Visit: Payer: Self-pay

## 2024-02-19 ENCOUNTER — Inpatient Hospital Stay

## 2024-02-19 VITALS — BP 132/81 | HR 66 | Temp 98.1°F | Resp 18 | Wt 160.8 lb

## 2024-02-19 DIAGNOSIS — M899 Disorder of bone, unspecified: Secondary | ICD-10-CM | POA: Diagnosis not present

## 2024-02-19 DIAGNOSIS — Z8546 Personal history of malignant neoplasm of prostate: Secondary | ICD-10-CM | POA: Insufficient documentation

## 2024-02-19 DIAGNOSIS — M8580 Other specified disorders of bone density and structure, unspecified site: Secondary | ICD-10-CM | POA: Insufficient documentation

## 2024-02-19 NOTE — Assessment & Plan Note (Addendum)
 Unclear cause CT Right iliac biopsy ordered Continue vit D and calcium DEXA scan ordered Follow up after above results available

## 2024-03-12 DIAGNOSIS — R768 Other specified abnormal immunological findings in serum: Secondary | ICD-10-CM | POA: Insufficient documentation

## 2024-03-12 NOTE — Progress Notes (Unsigned)
 [facility] OFFICE PROGRESS NOTE  Patient Care Team: Olene Berne, MD as PCP - General (Family Medicine)  Edward Hurst is a 83 y.o.male with history of asthma, hypertension, BPH and memory loss being [facility] for lytic lesions and questionable history of prostate cancer.   Work up showed normal Cr, Ca, hgb >10, negative for M-proteins. Kappa LC and PSA were borderline elevated. Bone scan was negative for avid lesions. MRI of prostate was negative for prostate lesion. Bone survey showed lytic lesions.   Discussed results. Discussed ddx of malignancy like myeloma vs osteoporosis. Report his PS is still fair and walking outside his complex and not bound to chair significantly. Recommend Dexa scan and bone marrow biopsy.  For some reason his dexa scan is scheduled for Dec. Discussed will need to change his scheduling. Assessment & Plan Bone lesion Unclear cause to be determined Given multiple lesions on bone survey CT Right iliac biopsy ordered Continue vit D and calcium DEXA scan to be rescheduled Follow up after above results available Elevated serum immunoglobulin free light chains Slight elevation of Kappa. Repeat labs  Will obtain bone marrow biopsy  Orders Placed This Encounter  Procedures   CT BONE MARROW BIOPSY & ASPIRATION    Standing Status:   Future    Expected Date:   03/27/2024    Expiration Date:   03/13/2025    Reason for Exam (SYMPTOM  OR DIAGNOSIS REQUIRED):   multiple lytic lesions, including R and L iliac crest. biopsy for diagnosis r/o malignancy    Preferred imaging location?:   [facility]    Radiology Contrast Protocol - do NOT remove file path:   [email or web address]     Lowanda Ruddy, MD  INTERVAL HISTORY: Patient returns for follow-up with wife and daughter. Report still doing chores, walking around the house outside. Arthritis pain over hands and knee. No new pain.  Bone survey:  IMPRESSION: 1. Age-indeterminate mild compression fracture deformity of T6, new since 2022. Recommend correlation with point tenderness. 2. Questionable lucent lesion at the posteroinferior endplate of C4, RIGHT iliac crest, LEFT iliac crest and RIGHT proximal humerus. Recommend correlation with laboratory values if there is a clinical concern for multiple myeloma. Differential considerations include degenerative changes or osseous trabeculation heterogeneity due to osteopenia. If further imaging is desired, this would be better assessed with dedicated MRI with and without contrast given underlying osteopenia. 3. Status post ORIF of bilateral femurs. Trace lucencies noted abutting the RIGHT femoral head screw could reflect loosening.  Initial history  Patient presented with microscopic hematuria and lower urinary tract symptoms in October 2024.  Workup showed 2 subcentimeter sized lytic lesions in the right iliac bone with thinning of the overlying cortex on CT scan.  CT also reported 1.3 x 2.1 cm irregular area over the left lung lower lobe favoring fibrosis/scarring likely present in 2021.  Cystoscopy reported negative for bladder mass, enlarged prostate.     09/14/23 creatinine 0.8   10/23/23 CT AP: 2 subcentimeter sized lytic lesions in the right iliac bone with thinning of the overlying cortex .1.3 x 2.1 cm irregular area over the left lung lower lobe favoring fibrosis/scarring likely present in 2021.     11/28/23 Cystoscopy reported negative for bladder mass, enlarged prostate.   PSA 3.4  PHYSICAL EXAMINATION: ECOG PERFORMANCE STATUS: 2 - Symptomatic, <50% confined to bed  Vitals:   03/13/24 1536  BP: (!) 140/86  Pulse: 69  Resp: 18  Temp: 97.8  F (36.6 C)  SpO2: 100%    Filed Weights   03/13/24 1536  Weight: 157 lb 9.6 oz (71.5 kg)   No distress Now lower extremity weakness  Relevant data reviewed during this visit included labs and imaging.

## 2024-03-12 NOTE — Assessment & Plan Note (Signed)
 Unclear cause to be determined Given multiple lesions on bone survey CT Right iliac biopsy ordered Continue vit D and calcium DEXA scan to be rescheduled Follow up after above results available

## 2024-03-12 NOTE — Assessment & Plan Note (Signed)
 Slight elevation of Kappa. Repeat labs  Will obtain bone marrow biopsy

## 2024-03-13 ENCOUNTER — Inpatient Hospital Stay

## 2024-03-13 ENCOUNTER — Other Ambulatory Visit: Payer: Self-pay

## 2024-03-13 ENCOUNTER — Inpatient Hospital Stay (HOSPITAL_BASED_OUTPATIENT_CLINIC_OR_DEPARTMENT_OTHER)

## 2024-03-13 VITALS — BP 140/86 | HR 69 | Temp 97.8°F | Resp 18 | Ht 69.0 in | Wt 157.6 lb

## 2024-03-13 DIAGNOSIS — M899 Disorder of bone, unspecified: Secondary | ICD-10-CM | POA: Diagnosis not present

## 2024-03-13 DIAGNOSIS — R768 Other specified abnormal immunological findings in serum: Secondary | ICD-10-CM

## 2024-03-13 DIAGNOSIS — M8580 Other specified disorders of bone density and structure, unspecified site: Secondary | ICD-10-CM | POA: Diagnosis not present

## 2024-03-13 LAB — CBC WITH DIFFERENTIAL (CANCER CENTER ONLY)
Abs Immature Granulocytes: 0.02 10*3/uL (ref 0.00–0.07)
Basophils Absolute: 0.1 10*3/uL (ref 0.0–0.1)
Basophils Relative: 1 %
Eosinophils Absolute: 0.2 10*3/uL (ref 0.0–0.5)
Eosinophils Relative: 2 %
HCT: 35.3 % — ABNORMAL LOW (ref 39.0–52.0)
Hemoglobin: 11.8 g/dL — ABNORMAL LOW (ref 13.0–17.0)
Immature Granulocytes: 0 %
Lymphocytes Relative: 20 %
Lymphs Abs: 1.5 10*3/uL (ref 0.7–4.0)
MCH: 31.6 pg (ref 26.0–34.0)
MCHC: 33.4 g/dL (ref 30.0–36.0)
MCV: 94.6 fL (ref 80.0–100.0)
Monocytes Absolute: 0.6 10*3/uL (ref 0.1–1.0)
Monocytes Relative: 7 %
Neutro Abs: 5.4 10*3/uL (ref 1.7–7.7)
Neutrophils Relative %: 70 %
Platelet Count: 224 10*3/uL (ref 150–400)
RBC: 3.73 MIL/uL — ABNORMAL LOW (ref 4.22–5.81)
RDW: 14.1 % (ref 11.5–15.5)
WBC Count: 7.8 10*3/uL (ref 4.0–10.5)
nRBC: 0 % (ref 0.0–0.2)

## 2024-03-13 LAB — CMP (CANCER CENTER ONLY)
ALT: 20 U/L (ref 0–44)
AST: 25 U/L (ref 15–41)
Albumin: 4.4 g/dL (ref 3.5–5.0)
Alkaline Phosphatase: 68 U/L (ref 38–126)
Anion gap: 4 — ABNORMAL LOW (ref 5–15)
BUN: 15 mg/dL (ref 8–23)
CO2: 30 mmol/L (ref 22–32)
Calcium: 9.7 mg/dL (ref 8.9–10.3)
Chloride: 102 mmol/L (ref 98–111)
Creatinine: 0.82 mg/dL (ref 0.61–1.24)
GFR, Estimated: >60 mL/min (ref >60)
Glucose, Bld: 139 mg/dL — ABNORMAL HIGH (ref 70–99)
Potassium: 4.6 mmol/L (ref 3.5–5.1)
Sodium: 136 mmol/L (ref 135–145)
Total Bilirubin: 0.6 mg/dL (ref 0.0–1.2)
Total Protein: 7.4 g/dL (ref 6.5–8.1)

## 2024-03-13 LAB — LACTATE DEHYDROGENASE: LDH: 263 U/L — ABNORMAL HIGH (ref 98–192)

## 2024-03-14 LAB — KAPPA/LAMBDA LIGHT CHAINS
Kappa free light chain: 24.2 mg/L — ABNORMAL HIGH (ref 3.3–19.4)
Kappa, lambda light chain ratio: 1.95 — ABNORMAL HIGH (ref 0.26–1.65)
Lambda free light chains: 12.4 mg/L (ref 5.7–26.3)

## 2024-03-17 LAB — MULTIPLE MYELOMA PANEL, SERUM
Albumin SerPl Elph-Mcnc: 3.9 g/dL (ref 2.9–4.4)
Albumin/Glob SerPl: 1.3 (ref 0.7–1.7)
Alpha 1: 0.2 g/dL (ref 0.0–0.4)
Alpha2 Glob SerPl Elph-Mcnc: 0.7 g/dL (ref 0.4–1.0)
B-Globulin SerPl Elph-Mcnc: 0.9 g/dL (ref 0.7–1.3)
Gamma Glob SerPl Elph-Mcnc: 1.2 g/dL (ref 0.4–1.8)
Globulin, Total: 3.1 g/dL (ref 2.2–3.9)
IgA: 261 mg/dL (ref 61–437)
IgG (Immunoglobin G), Serum: 1299 mg/dL (ref 603–1613)
IgM (Immunoglobulin M), Srm: 89 mg/dL (ref 15–143)
Total Protein ELP: 7 g/dL (ref 6.0–8.5)

## 2024-04-08 ENCOUNTER — Telehealth: Payer: Self-pay

## 2024-04-08 NOTE — Telephone Encounter (Signed)
 Called to inform the patient of rescheduled appointment details. Left the patient a voicemail with changes made.

## 2024-04-10 ENCOUNTER — Ambulatory Visit

## 2024-04-11 ENCOUNTER — Ambulatory Visit (HOSPITAL_COMMUNITY)

## 2024-04-11 ENCOUNTER — Ambulatory Visit (HOSPITAL_COMMUNITY)
Admission: RE | Admit: 2024-04-11 | Discharge: 2024-04-11 | Disposition: A | Discharge status: Home / Self Care | Source: Ambulatory Visit

## 2024-04-11 DIAGNOSIS — R768 Other specified abnormal immunological findings in serum: Secondary | ICD-10-CM

## 2024-04-11 DIAGNOSIS — M899 Disorder of bone, unspecified: Secondary | ICD-10-CM

## 2024-04-28 ENCOUNTER — Inpatient Hospital Stay

## 2024-05-01 NOTE — Progress Notes (Signed)
 No show

## 2024-05-02 ENCOUNTER — Telehealth: Payer: Self-pay | Admitting: *Deleted

## 2024-05-02 NOTE — Telephone Encounter (Signed)
 Called pt to f/u about scheduling bone density and biopsy. Left message to call facility when possible. There are no other contacts on pt facesheet to f/u with.

## 2024-05-06 ENCOUNTER — Telehealth: Payer: Self-pay | Admitting: *Deleted

## 2024-05-06 NOTE — Telephone Encounter (Signed)
-----   Message from Nurse Porsche C sent at 05/02/2024  4:26 PM EDT ----- Called this pt to f/u when best time to schedule images and biopsy. There was no answer and left vm. F/u when you have time. There were no other contact number on the snapshot. ----- Message ----- From: Lowanda Ruddy, MD Sent: 05/02/2024   2:30 PM EDT To: Reggie Caper, LPN  Porsche He needs bone density and biopsy. Has not scheduled either. Hope he is ok. Thanks

## 2024-05-06 NOTE — Telephone Encounter (Signed)
 Wife states afternoons are best time for scans.  LM for Radiology at [facility] to see if they can schedule the bone density.   Sent message to [facility] in [place] scheduling to arrange CT biopsy

## 2024-05-12 ENCOUNTER — Telehealth: Payer: Self-pay | Admitting: *Deleted

## 2024-05-12 NOTE — Telephone Encounter (Signed)
 Have need unable to get an appointment for bone density scan sooner than December 2025. Wife was given number to [employer] last week to schedule. RN called [facility] and asked them to call patient/wife to arrange ASAP.

## 2024-06-02 ENCOUNTER — Other Ambulatory Visit: Payer: Self-pay | Admitting: Radiology

## 2024-06-02 DIAGNOSIS — R768 Other specified abnormal immunological findings in serum: Secondary | ICD-10-CM

## 2024-06-02 NOTE — H&P (Signed)
 Chief Complaint: Lytic bony lesions,  Referring Provider(s): Chang,R  Supervising Physician: Hughes Simmonds  Patient Status: [facility] - Out-pt  History of Present Illness: Edward Hurst is an 83 y.o. male ex smoker with past medical history significant for asthma, hyperlipidemia, hypertension, sleep apnea, prostate cancer.  Recent bone scan on 02/15/2024 revealed:  1. Age-indeterminate mild compression fracture deformity of T6, new since 2022. Recommend correlation with point tenderness. 2. Questionable lucent lesion at the posteroinferior endplate of C4, RIGHT iliac crest, LEFT iliac crest and RIGHT proximal humerus. Recommend correlation with laboratory values if there is a clinical concern for multiple myeloma. Differential considerations include degenerative changes or osseous trabeculation heterogeneity due to osteopenia. If further imaging is desired, this would be better assessed with dedicated MRI with and without contrast given underlying osteopenia. 3. Status post ORIF of bilateral femurs. Trace lucencies noted abutting the RIGHT femoral head screw could reflect loosening.  He is scheduled today for image guided bone marrow biopsy for further evaluation.  *** Patient is Full Code  Past Medical History:  Diagnosis Date   Asthma    Chronic headache    Hyperlipidemia    Hypertension    Sleep apnea     No past surgical history on file.  Allergies: Patient has no known allergies.  Medications: Prior to Admission medications   Medication Sig Start Date End Date Taking? Authorizing Provider  albuterol  (VENTOLIN  HFA) 108 (90 Base) MCG/ACT inhaler Inhale 1-2 puffs into the lungs every 6 (six) hours as needed for wheezing or shortness of breath. 03/15/21   Layden, Lindsey A, PA-C  benzonatate  (TESSALON ) 100 MG capsule 1 capsule as needed Oral Three times a day for 10 days Patient not taking: Reported on 02/19/2024 10/29/23   [provider]  Calcium  Carb-Cholecalciferol (CALCIUM-VITAMIN D3) 600-500 MG-UNIT CAPS  05/19/20   [provider]  finasteride (PROSCAR) 5 MG tablet Take 5 mg by mouth daily. 02/15/21   [provider]  fluticasone-salmeterol (ADVAIR  HFA) 230-21 MCG/ACT inhaler Inhale 2 puffs into the lungs 2 (two) times daily. 09/15/21   Kassie Acquanetta Bradley, MD  gabapentin (NEURONTIN) 300 MG capsule 1 capsule Orally three times per day for 90 days As needed for pain and numbness 09/04/23   [provider]  hydrOXYzine (ATARAX) 10 MG tablet Take 10 mg by mouth 3 (three) times daily as needed.    [provider]  lisinopril (ZESTRIL) 20 MG tablet Take 1 tablet by mouth daily. 01/27/21   [provider]  metformin (FORTAMET) 500 MG (OSM) 24 hr tablet Oral 10/29/23   [provider]  montelukast (SINGULAIR) 10 MG tablet Take 1 tablet by mouth daily. 03/30/21   [provider]  Multiple Vitamins-Minerals (CENTRUM SILVER 50+MEN PO) Take by mouth.    [provider]  pantoprazole (PROTONIX) 40 MG tablet Take 40 mg by mouth daily. 12/07/23   [provider]  sildenafil (VIAGRA) 100 MG tablet Take 100 mg by mouth daily as needed. Patient not taking: Reported on 02/19/2024 11/22/20   [provider]  tamsulosin (FLOMAX) 0.4 MG CAPS capsule Take 0.4 mg by mouth daily. 04/06/21   [provider]  tiZANidine (ZANAFLEX) 2 MG tablet 1 tablet at bedtime as needed Oral Once a day for 30 days 10/29/23   [provider]  verapamil (VERELAN PM) 240 MG 24 hr capsule Take 240 mg by mouth daily. 02/15/21   [provider]     No family history on file.  Social  History   Socioeconomic History   Marital status: Married    Spouse name: Not on file   Number of children: Not on file   Years of education: Not on file   Highest education level: Not on file  Occupational History   Not on file  Tobacco Use   Smoking status: Former    Current packs/day:  0.00    Average packs/day: 3.0 packs/day for 10.0 years (30.0 ttl pk-yrs)    Types: Cigarettes    Start date: 47    Quit date: 28    Years since quitting: 44.5   Smokeless tobacco: Never  Vaping Use   Vaping status: Never Used  Substance and Sexual Activity   Alcohol use: Not Currently   Drug use: Never   Sexual activity: Not on file  Other Topics Concern   Not on file  Social History Narrative   Lives with wife   Social Drivers of Corporate investment banker Strain: Not on file  Food Insecurity: Not on file  Transportation Needs: Not on file  Physical Activity: Not on file  Stress: Not on file  Social Connections: Unknown (04/04/2022)   [facility]   Social Network    Social Network: Not on file       Review of Systems  Vital Signs:   Advance Care Plan: No documents on file    Physical Exam  Imaging: No results found.  Labs:  CBC: Recent Labs    12/14/23 1113 03/13/24 1507  WBC 7.6 7.8  HGB 11.9* 11.8*  HCT 35.7* 35.3*  PLT 208 224    COAGS: No results for input(s): INR, APTT in the last 8760 hours.  BMP: Recent Labs    12/14/23 1113 03/13/24 1507  NA 139 136  K 4.3 4.6  CL 104 102  CO2 31 30  GLUCOSE 107* 139*  BUN 16 15  CALCIUM 9.5 9.7  CREATININE 0.77 0.82  GFRNONAA >60 >60    LIVER FUNCTION TESTS: Recent Labs    12/14/23 1113 03/13/24 1507  BILITOT 0.6 0.6  AST 25 25  ALT 20 20  ALKPHOS 58 68  PROT 7.0 7.4  ALBUMIN 4.1 4.4    TUMOR MARKERS: Recent Labs    12/14/23 1113  CEA 2.36    Assessment and Plan: 83 y.o. male ex smoker with past medical history significant for asthma, hyperlipidemia, hypertension, sleep apnea, prostate cancer.  Recent bone scan on 02/15/2024 revealed:  1. Age-indeterminate mild compression fracture deformity of T6, new since 2022. Recommend correlation with point tenderness. 2. Questionable lucent lesion at the posteroinferior endplate of C4, RIGHT iliac crest,  LEFT iliac crest and RIGHT proximal humerus. Recommend correlation with laboratory values if there is a clinical concern for multiple myeloma. Differential considerations include degenerative changes or osseous trabeculation heterogeneity due to osteopenia. If further imaging is desired, this would be better assessed with dedicated MRI with and without contrast given underlying osteopenia. 3. Status post ORIF of bilateral femurs. Trace lucencies noted abutting the RIGHT femoral head screw could reflect loosening.  He is scheduled today for image guided bone marrow biopsy for further evaluation.Risks and benefits of procedure was discussed with the patient and/or patient's family including, but not limited to bleeding, infection, damage to adjacent structures or low yield requiring additional tests.  All of the questions were answered and there is agreement to proceed.  Consent signed and in chart.    Thank you for allowing our service to participate  in Edward Hurst 's care.  Electronically Signed: D. Franky Rakers, PA-C   06/02/2024, 11:27 AM      I spent a total of  15 Minutes   in face to face in clinical consultation, greater than 50% of which was counseling/coordinating care for image guided bone marrow biopsy

## 2024-06-03 ENCOUNTER — Ambulatory Visit (HOSPITAL_COMMUNITY)
Admission: RE | Admit: 2024-06-03 | Discharge: 2024-06-03 | Disposition: A | Discharge status: Home / Self Care | Source: Ambulatory Visit

## 2024-06-03 ENCOUNTER — Encounter (HOSPITAL_COMMUNITY): Payer: Self-pay

## 2024-06-03 DIAGNOSIS — Z87891 Personal history of nicotine dependence: Secondary | ICD-10-CM | POA: Insufficient documentation

## 2024-06-03 DIAGNOSIS — J45909 Unspecified asthma, uncomplicated: Secondary | ICD-10-CM | POA: Diagnosis not present

## 2024-06-03 DIAGNOSIS — M8580 Other specified disorders of bone density and structure, unspecified site: Secondary | ICD-10-CM | POA: Insufficient documentation

## 2024-06-03 DIAGNOSIS — M899 Disorder of bone, unspecified: Secondary | ICD-10-CM

## 2024-06-03 DIAGNOSIS — E785 Hyperlipidemia, unspecified: Secondary | ICD-10-CM | POA: Diagnosis not present

## 2024-06-03 DIAGNOSIS — I10 Essential (primary) hypertension: Secondary | ICD-10-CM | POA: Diagnosis not present

## 2024-06-03 DIAGNOSIS — G4730 Sleep apnea, unspecified: Secondary | ICD-10-CM | POA: Diagnosis not present

## 2024-06-03 DIAGNOSIS — D649 Anemia, unspecified: Secondary | ICD-10-CM | POA: Insufficient documentation

## 2024-06-03 DIAGNOSIS — R768 Other specified abnormal immunological findings in serum: Secondary | ICD-10-CM

## 2024-06-03 LAB — CBC WITH DIFFERENTIAL/PLATELET
Abs Immature Granulocytes: 0.02 K/uL (ref 0.00–0.07)
Basophils Absolute: 0.1 K/uL (ref 0.0–0.1)
Basophils Relative: 1 %
Eosinophils Absolute: 0.2 K/uL (ref 0.0–0.5)
Eosinophils Relative: 2 %
HCT: 39.1 % (ref 39.0–52.0)
Hemoglobin: 12.5 g/dL — ABNORMAL LOW (ref 13.0–17.0)
Immature Granulocytes: 0 %
Lymphocytes Relative: 19 %
Lymphs Abs: 1.4 K/uL (ref 0.7–4.0)
MCH: 31.8 pg (ref 26.0–34.0)
MCHC: 32 g/dL (ref 30.0–36.0)
MCV: 99.5 fL (ref 80.0–100.0)
Monocytes Absolute: 0.5 K/uL (ref 0.1–1.0)
Monocytes Relative: 6 %
Neutro Abs: 5.5 K/uL (ref 1.7–7.7)
Neutrophils Relative %: 72 %
Platelets: 222 K/uL (ref 150–400)
RBC: 3.93 MIL/uL — ABNORMAL LOW (ref 4.22–5.81)
RDW: 14.6 % (ref 11.5–15.5)
WBC: 7.7 K/uL (ref 4.0–10.5)
nRBC: 0 % (ref 0.0–0.2)

## 2024-06-03 MED ORDER — FENTANYL CITRATE (PF) 100 MCG/2ML IJ SOLN
INTRAMUSCULAR | Status: AC
  Filled 2024-06-03: qty 2

## 2024-06-03 MED ORDER — FENTANYL CITRATE (PF) 100 MCG/2ML IJ SOLN
INTRAMUSCULAR | Status: AC | PRN
  Administered 2024-06-03 (×2): 50 ug via INTRAVENOUS

## 2024-06-03 MED ORDER — SODIUM CHLORIDE 0.9 % IV SOLN: INTRAVENOUS | Status: DC

## 2024-06-03 NOTE — Discharge Instructions (Signed)
 PLEASE CALL INTERVENTIONAL RADIOLOGY CLINIC AT [phone] WITH ANY QUESTIONS OR CONCERNS  MAY REMOVE DRESSING AND SHOWER TOMORROW

## 2024-06-03 NOTE — Procedures (Signed)
 Vascular and Interventional Radiology Procedure Note  Patient: Edward Hurst DOB: 1941-03-14 Medical Record Number: [id number] Note Date/Time: 06/03/24 9:45 AM   Performing Physician: Thom Hall, MD Assistant(s): None  Diagnosis: Hx lytic bone lesions. Q MM   Procedure: BONE MARROW ASPIRATION and BIOPSY  Anesthesia: Conscious Sedation Complications: None Estimated Blood Loss: Minimal Specimens: Sent for Pathology  Findings:  Successful CT-guided bone marrow aspiration and biopsy A total of 1 cores were obtained. Hemostasis of the tract was achieved using Manual Pressure.  Plan: Bed rest for 1 hours.  See detailed procedure note with images in PACS. The patient tolerated the procedure well without incident or complication and was returned to Recovery in stable condition.    Thom Hall, MD [facility]   Pager. [phone] Clinic. [phone]

## 2024-06-03 NOTE — Sedation Documentation (Signed)
 RN Chesley Veasey pulled 100 mcg Fentanyl  in. Pt. Received 100 mcg Fentanyl  throughout the procedure.

## 2024-06-05 LAB — SURGICAL PATHOLOGY

## 2024-06-12 ENCOUNTER — Ambulatory Visit: Payer: Self-pay

## 2024-06-12 ENCOUNTER — Encounter (HOSPITAL_COMMUNITY): Payer: Self-pay

## 2024-06-12 NOTE — Progress Notes (Signed)
 Contacted [facility] Dexa scan scheduled 07/03/24 at 9 am. Contacted pt's wife and she is aware of date and time. Pt to hold calcium the day before and day of scan.

## 2024-06-12 NOTE — Progress Notes (Signed)
 Contacted pt's wife to let  pt know no signs of multiple myeloma or cancer from bone marrow biopsy. Pt had reported lucent lesion on imaging. This could be osteoporosis.I had ordered dexa scan. Pt's wife acknowledged information and verbalized understanding.

## 2024-07-03 ENCOUNTER — Ambulatory Visit: Payer: Self-pay

## 2024-07-03 NOTE — Telephone Encounter (Signed)
-----   Message from Pauletta JAYSON Chihuahua sent at 07/03/2024 10:37 AM EDT ----- Hi Shakari Qazi His Bone density is not scheduled would you fax to [facility]? Thank you ----- Message ----- From: Interface, Lab In Three Zero One Sent: 06/05/2024   2:31 PM EDT To: Pauletta JAYSON Chihuahua, MD

## 2024-07-03 NOTE — Telephone Encounter (Signed)
 Called pt wife to f/u on BD scan appt. She states that he is scheduled for 9/10 at 1pm. Advised to call office if there were any questions. Wife verbalized understanding.

## 2024-10-31 ENCOUNTER — Other Ambulatory Visit
